# Patient Record
Sex: Female | Born: 1955 | Race: White | Hispanic: No | Smoking: Never smoker
Health system: Southern US, Community
[De-identification: ages and names within clinical notes are randomized; demographics above are authoritative.]

## PROBLEM LIST (undated history)

## (undated) DIAGNOSIS — N189 Chronic kidney disease, unspecified: Secondary | ICD-10-CM

## (undated) DIAGNOSIS — H539 Unspecified visual disturbance: Secondary | ICD-10-CM

## (undated) DIAGNOSIS — K5190 Ulcerative colitis, unspecified, without complications: Secondary | ICD-10-CM

## (undated) DIAGNOSIS — M810 Age-related osteoporosis without current pathological fracture: Secondary | ICD-10-CM

## (undated) DIAGNOSIS — N200 Calculus of kidney: Secondary | ICD-10-CM

## (undated) DIAGNOSIS — G35 Multiple sclerosis: Secondary | ICD-10-CM

## (undated) HISTORY — DX: Age-related osteoporosis without current pathological fracture: M81.0

## (undated) HISTORY — DX: Chronic kidney disease, unspecified: N18.9

## (undated) HISTORY — PX: CHOLECYSTECTOMY, LAPAROSCOPIC: SHX56

## (undated) HISTORY — PX: LITHOTRIPSY: SUR834

## (undated) HISTORY — DX: Ulcerative colitis, unspecified, without complications: K51.90

## (undated) HISTORY — DX: Calculus of kidney: N20.0

## (undated) HISTORY — DX: Unspecified visual disturbance: H53.9

## (undated) HISTORY — DX: Multiple sclerosis: G35

## (undated) HISTORY — PX: WISDOM TOOTH EXTRACTION: SHX21

## (undated) HISTORY — PX: COLOSTOMY: SHX63

---

## 1998-03-09 ENCOUNTER — Ambulatory Visit (HOSPITAL_COMMUNITY): Admission: RE | Admit: 1998-03-09 | Discharge: 1998-03-09 | Payer: Self-pay | Admitting: Gastroenterology

## 1999-08-21 ENCOUNTER — Other Ambulatory Visit: Admission: RE | Admit: 1999-08-21 | Discharge: 1999-08-21 | Payer: Self-pay | Admitting: Family Medicine

## 2000-10-28 ENCOUNTER — Other Ambulatory Visit: Admission: RE | Admit: 2000-10-28 | Discharge: 2000-10-28 | Payer: Self-pay | Admitting: Obstetrics and Gynecology

## 2002-01-28 ENCOUNTER — Other Ambulatory Visit: Admission: RE | Admit: 2002-01-28 | Discharge: 2002-01-28 | Payer: Self-pay | Admitting: Obstetrics and Gynecology

## 2003-06-07 ENCOUNTER — Other Ambulatory Visit: Admission: RE | Admit: 2003-06-07 | Discharge: 2003-06-07 | Payer: Self-pay | Admitting: Obstetrics and Gynecology

## 2012-12-09 ENCOUNTER — Other Ambulatory Visit: Payer: Self-pay | Admitting: Oral Surgery

## 2012-12-09 DIAGNOSIS — R6884 Jaw pain: Secondary | ICD-10-CM

## 2012-12-18 ENCOUNTER — Other Ambulatory Visit: Payer: Self-pay

## 2012-12-19 ENCOUNTER — Ambulatory Visit
Admission: RE | Admit: 2012-12-19 | Discharge: 2012-12-19 | Disposition: A | Discharge status: Home / Self Care | Payer: Medicare Other | Source: Ambulatory Visit | Attending: Oral Surgery | Admitting: Oral Surgery

## 2012-12-19 DIAGNOSIS — R6884 Jaw pain: Secondary | ICD-10-CM

## 2014-08-08 DIAGNOSIS — N200 Calculus of kidney: Secondary | ICD-10-CM | POA: Insufficient documentation

## 2014-08-08 DIAGNOSIS — N201 Calculus of ureter: Secondary | ICD-10-CM | POA: Insufficient documentation

## 2014-11-09 ENCOUNTER — Telehealth: Payer: Self-pay | Admitting: Neurology

## 2014-11-09 NOTE — Telephone Encounter (Signed)
Spoke with Cindy Frederick who has questions about how Tysabri infusions will be handled at [facility].  I advised for now Tysabri's are done at [facility]. I also advised that we plan to start doing infusions at [facility], but am unable to answer specific questions about when or how these will be handled, as [facility] is still in the process of setting this up.  I offered appt with RAS but she declined at this time.  She is aware she must be seen before Tysabri can be ordered./fim

## 2014-11-09 NOTE — Telephone Encounter (Signed)
Patient is calling regarding infusions. Please call and advise.

## 2015-04-12 ENCOUNTER — Telehealth: Payer: Self-pay | Admitting: Neurology

## 2015-04-12 NOTE — Telephone Encounter (Signed)
Thayer Ohm from [place] on behalf of [place] [facility] called and requested to speak with Faith RN regarding the patient. Please call and advise.

## 2015-04-12 NOTE — Telephone Encounter (Signed)
I have spoken with Thayer Ohm who sts. he needs clarification of tx. provided at [facility] in 2015.  I have explained to him that pt. is not a current pt. of Dr. Bonnita Hollow, that she has never been seen in the new office.  He verbalized understanding of same, but sts. this is paperwork that RAS still needs to complete.  I have explained we have no med. records on this pt. to complete any paperwork, and that again, she is not a current pt. in this office and paperwork should be sent to [facility].  Chris sts. he will note that "provider refuses to participate."  I have advised that that is not correct, that pt. is simply not currently a pt. of Dr. Bonnita Hollow.  Finally, I asked Thayer Ohm to fax me the paperwork he is referring to, and asked that he fax it to [phone] (fax machine in my pod), but he sts. he is not able to do this until I fax him a cover letter from our office, as he has a different fax #.  I advised he may use the fax # he has/fim

## 2015-04-16 ENCOUNTER — Other Ambulatory Visit: Payer: Self-pay | Admitting: Neurology

## 2015-04-16 NOTE — Telephone Encounter (Signed)
Patient has not been seen at [facility].  Per Faith we are unable to fill Rx's until the patient is seen here.

## 2015-04-18 NOTE — Telephone Encounter (Signed)
Antrenise called in wanting to know the status of "the paperwork that was faxed for faith to fill out". i read her the message below and she still asked that the paperwork be completed. I faxed her a confirmation page from fax [phone] so she could resend the paperwork. Faith RN aware and stated that Dr. Epimenio Foot has the paperwork and will handle the issue

## 2015-08-30 ENCOUNTER — Telehealth: Payer: Self-pay

## 2015-08-30 NOTE — Telephone Encounter (Signed)
Pt informed. To come by office to p.u. Prednisone pack.

## 2015-08-30 NOTE — Telephone Encounter (Signed)
Prednisone 20 mg x 3 days, 10 mg x 3 days, then stop. Follow up if symptoms persist, progress, or she becomes febrile.

## 2015-08-30 NOTE — Telephone Encounter (Signed)
Pt has viral type symptoms with head congestion, nasal congestion and cough. Has MS and was seen by her MS doctor who agreed that symptoms were viral but may need small dose of Prednisone to help with inflammation and MS exacerbation. Requesting Prednisone.

## 2015-12-26 DIAGNOSIS — R261 Paralytic gait: Secondary | ICD-10-CM | POA: Insufficient documentation

## 2015-12-26 DIAGNOSIS — G35 Multiple sclerosis: Secondary | ICD-10-CM | POA: Insufficient documentation

## 2016-04-18 ENCOUNTER — Telehealth: Payer: Self-pay | Admitting: *Deleted

## 2016-04-18 NOTE — Telephone Encounter (Signed)
LMOM, and I will call her again on Monday am as well.  She has a new pt. appt. sched. with RAS in about 3 weeks.   She is a former pt of his from [facility], and has been seeing Dr. Craige Cotta there since Dr. Epimenio Foot left.  She is now going to transfer care back to Dr. Epimenio Foot.  I believe her Donnamarie Poag is due now, per pt.  Dr. Epimenio Foot is able to work her in on Monday.  I need to check with [facility] first . If they have already received her Ty from a specialty pharmacy, she will need to have Tysabri there.  Also, the process of switching her from [facility] to [facility] for Ty may take up to sev. weeks.  (Because [facility] has to verify benefits, order med, etc.)  So, she may want to proceed with next Ty infusion at [facility] in order to stay on time./fim

## 2016-04-21 ENCOUNTER — Encounter: Payer: Self-pay | Admitting: Neurology

## 2016-04-21 ENCOUNTER — Ambulatory Visit (INDEPENDENT_AMBULATORY_CARE_PROVIDER_SITE_OTHER): Payer: Medicare Other | Admitting: Neurology

## 2016-04-21 VITALS — BP 116/74 | HR 74 | Resp 14 | Ht 59.0 in | Wt 124.0 lb

## 2016-04-21 DIAGNOSIS — G4700 Insomnia, unspecified: Secondary | ICD-10-CM | POA: Insufficient documentation

## 2016-04-21 DIAGNOSIS — G35 Multiple sclerosis: Secondary | ICD-10-CM | POA: Diagnosis not present

## 2016-04-21 DIAGNOSIS — R5383 Other fatigue: Secondary | ICD-10-CM | POA: Insufficient documentation

## 2016-04-21 DIAGNOSIS — F329 Major depressive disorder, single episode, unspecified: Secondary | ICD-10-CM

## 2016-04-21 DIAGNOSIS — F32A Depression, unspecified: Secondary | ICD-10-CM | POA: Insufficient documentation

## 2016-04-21 DIAGNOSIS — R261 Paralytic gait: Secondary | ICD-10-CM | POA: Insufficient documentation

## 2016-04-21 NOTE — Progress Notes (Signed)
[facility]  PATIENT: Cindy Frederick DOB: 02-23-1956  REFERRING DOCTOR OR PCP:  Dr. Ardelle Park (PCP), was seeing Dr. Craige Cotta [facility]) SOURCE: patient, records from Dr. Craige Cotta  _________________________________   HISTORICAL  CHIEF COMPLAINT:  Chief Complaint  Patient presents with  . Multiple Sclerosis    Cindy Frederick is here for eval of MS.  She is a former pt. of Dr. Bonnita Hollow from [facility], and has most recently bee followed by Dr. Craige Cotta at [facility]. Sts. she was dx. in 1991. Presenting sx. was optic neuritis (right eye, then left eye).  Sts. was dx. with MRI. No LP. For the first 5 yrs. she only had po or IV steroids. Around 1996, she started Avonex but stopped about a yr. later due to side effects and inj. site fatigue.  She then started Betaseron but stopped around 2012 due to progression of sx., new   . Gait Disturbance    lesions.  Sts. she then transferred care from Dr. Adella Hare to Dr. Epimenio Foot, and Dr. Epimenio Foot started her on Tysabri. She is due for her next Tysabri infusion 04-23-16.  Sts. JCV ab was last checked 2-3 mos. ago and was low positive. Sts. her last MRI brain was about 18 mos. ago.  She has started Ampyra and feels it helps./fim    HISTORY OF PRESENT ILLNESS:  I had the pleasure seeing you patient, Cindy Frederick, at [facility] for neurologic consultation regarding her multiple sclerosis. I have previously seen her at [facility].   She is currently on Tysabri and has had 57 doses over the last 4-5 years. She would like to switch infusions to this site. She has tested borderline positive is JCV antibody but this has been stable. She was last tested about 3 or 4 months ago.  MS history:    She presented in 1991 with right optic neuritis. She was treated with IV steroids and improved. A year later, she had left optic neuritis and was again treated with IV steroids. She improved that time as well. Around 1996 she had the onset of numbness  in her legs. She was started on Avonex. However, she had trouble tolerating that and one year later switched to Betaseron. She stayed on Betaseron until 2012. In the late 1990's  and the 2000's she had progressive gait disability with left leg weakness and spasticity. I placed her on Tysabri when she transferred to my care in 2012. She has since received 57 infusions of Tysabri. She tolerates it well and has had no exacerbations while on it.     Gait/strength/sensation: She has had worsening of her gait, predominantly due to left leg weakness and spasticity since the late 1990s. She received multiple doses of IV Solu-Medrol in the past. More recently, about a year ago, she was placed on Ampyra and that has helped her gait. She also has some weakness in the left arm though it is not as weak as the leg. She denies any significant numbness or tingling.  Bladder/bowel: She has had some issues with urinary frequency and urgency and has a prescription of Vesicare but she only takes it on rare days when she will be out of the house for long time. She has a colostomy since age 16 because of ulcerative colitis.  Vision: She had a good recovery after her 2 bouts of optic neuritis. However, when she is stressed or tired she will note mild visual blurring.  Mood/cognition: She has had some issues with depression  and received a benefit when started on Cymbalta. She feels mood is better than was a couple years ago. Earlier this year when she was having some issues with her mom's health she had little worsening of her depression but decided not to add a second agent. She denies any significant difficulties with cognition.  Fatigue/sleep: She notes some fatigue that is worse when the temperatures are hot. Amantadine has helped some. She has insomnia helped by melatonin. She sleeps well almost every night now.   REVIEW OF SYSTEMS: Constitutional: No fevers, chills, sweats, or change in appetite.   She notes fatigue.    Some insomnia Eyes: No visual changes, double vision, eye pain Ear, nose and throat: No hearing loss, ear pain, nasal congestion, sore throat Cardiovascular: No chest pain, palpitations Respiratory: No shortness of breath at rest or with exertion.   No wheezes GastrointestinaI: No nausea, vomiting, diarrhea, abdominal pain, fecal incontinence Genitourinary: Notes frequency but no incontinence.  No nocturia. Musculoskeletal: No neck pain, back pain Integumentary: No rash, pruritus, skin lesions Neurological: as above Psychiatric: Mild depression at this time.  No anxiety Endocrine: No palpitations, diaphoresis, change in appetite, change in weigh or increased thirst Hematologic/Lymphatic: No anemia, purpura, petechiae. Allergic/Immunologic: No itchy/runny eyes, nasal congestion, recent allergic reactions, rashes  ALLERGIES: Allergies  Allergen Reactions  . Sulfa Antibiotics Hives  . Betadine [Povidone Iodine] Rash    HOME MEDICATIONS:  Current outpatient prescriptions:  .  alendronate (FOSAMAX) 70 MG tablet, , Disp: , Rfl: 4 .  Amantadine HCl 100 MG tablet, Take 100 mg by mouth 2 (two) times daily., Disp: , Rfl:  .  cholecalciferol (VITAMIN D) 1000 units tablet, Take 2,000 Units by mouth daily., Disp: , Rfl:  .  dalfampridine (AMPYRA) 10 MG TB12, Take 10 mg by mouth 2 (two) times daily., Disp: , Rfl:  .  DULoxetine (CYMBALTA) 60 MG capsule, Take 60 mg by mouth daily., Disp: , Rfl:  .  multivitamin-iron-minerals-folic acid (CENTRUM) chewable tablet, Chew 1 tablet by mouth daily., Disp: , Rfl:  .  natalizumab (TYSABRI) 300 MG/15ML injection, Inject 300 mg into the vein every 30 (thirty) days., Disp: , Rfl:  .  Omega-3 Fatty Acids (FISH OIL OMEGA-3 PO), Take by mouth., Disp: , Rfl:  .  Red Yeast Rice Extract (RED YEAST RICE PO), Take by mouth., Disp: , Rfl:  .  temazepam (RESTORIL) 7.5 MG capsule, 15 mg at bedtime as needed., Disp: , Rfl:  .  vitamin B-12 (CYANOCOBALAMIN) 100 MCG  tablet, Take 100 mcg by mouth daily., Disp: , Rfl:   PAST MEDICAL HISTORY: Past Medical History  Diagnosis Date  . Multiple sclerosis (HCC)   . Vision abnormalities   . Chronic kidney disease   . Kidney stones     PAST SURGICAL HISTORY: Past Surgical History  Procedure Laterality Date  . Lithotripsy    . Cholecystectomy, laparoscopic    . Wisdom tooth extraction      FAMILY HISTORY: Family History  Problem Relation Age of Onset  . Breast cancer Mother   . Hypertension Mother   . Diabetes type II Mother   . Kidney disease Mother   . Congestive Heart Failure Mother   . Heart disease Father   . Hypertension Father   . Seizures Father   . Lymphoma Father   . Epilepsy Father     SOCIAL HISTORY:  Social History   Social History  . Marital Status: Married    Spouse Name: N/A  . Number of  Children: N/A  . Years of Education: N/A   Occupational History  . Not on file.   Social History Main Topics  . Smoking status: Never Smoker   . Smokeless tobacco: Not on file  . Alcohol Use: No  . Drug Use: No  . Sexual Activity: Not on file   Other Topics Concern  . Not on file   Social History Narrative  . No narrative on file     PHYSICAL EXAM  Filed Vitals:   04/21/16 1443  BP: 116/74  Pulse: 74  Resp: 14  Height:  (1.499 m)  Weight: 124 lb (56.246 kg)    Body mass index is 25.03 kg/(m^2).   General: The patient is well-developed and well-nourished and in no acute distress  Eyes:  Funduscopic exam shows normal optic discs and retinal vessels.  Neck: The neck is supple, no carotid bruits are noted.  The neck is nontender.  Cardiovascular: The heart has a regular rate and rhythm with a normal S1 and S2. There were no murmurs, gallops or rubs. Lungs are clear to auscultation.  Skin: Extremities are without significant edema.  Musculoskeletal:  Back is nontender  Neurologic Exam  Mental status: The patient is alert and oriented x 3 at the time  of the examination. The patient has apparent normal recent and remote memory, with an apparently normal attention span and concentration ability.   Speech is normal.  Cranial nerves: Extraocular movements are full. Pupils are equal, round, and reactive to light and accomodation.  Visual fields are full.  Facial symmetry is present. There is good facial sensation to soft touch bilaterally.Facial strength is normal.  Trapezius and sternocleidomastoid strength is normal. No dysarthria is noted.  The tongue is midline, and the patient has symmetric elevation of the soft palate. No obvious hearing deficits are noted.  Motor:  Muscle bulk is normal.   Tone is increased in legs, left > right and mildly in left arm. Strength is  5 / 5 on the right except 4+ right EHL and 4+ to 5/5 in left arm and 4- left EHL/TA and 4 to 4+/5 elsewhere in left leg.   Sensory: Sensory testing is intact to pinprick, soft touch and vibration sensation in all 4 extremities.  Coordination: Cerebellar testing reveals mildly reduced left finger-nose-finger, mildly reduced left RAM in arm and poor left heel-to-shin bilaterally.  Gait and station: Station is normal.   Gait is spastic and she has a mild left foot drop. Tandem gait is wide. Romberg is negative.   Reflexes: Deep tendon reflexes are symmetric and increased in leftr arm and both legs with spread at the left knee.  Plantar responses are flexor.    DIAGNOSTIC DATA (LABS, IMAGING, TESTING) - I reviewed patient records, labs, notes, testing and imaging myself where available.      ASSESSMENT AND PLAN  Multiple sclerosis (HCC)  Spastic gait  Depression  Insomnia  Other fatigue   1.   She will get her next infusion at [facility] since they already have her drug from specialty pharmacy. She will then get her next infusion and subsequent infusions with [place]. 2.   In a few months we will recheck the JCV antibody. 3.   Continue Ampyra, if gait worsens, consider  an AFO fo rthe left. 4.  Continue Cymbalta for mood. 5.   She will return to see me in about 5 months.  45 minute Face-to-face evaluation with greater than one half of the time counseling  correlating care about her MS treatments and symptoms  Daquana Paddock A. Epimenio Foot, MD, PhD 04/21/2016, 2:54 PM Certified in Neurology, Clinical Neurophysiology, Sleep Medicine, Pain Medicine and Neuroimaging  [facility] [address], [address] [place], [place] [address] [phone]

## 2016-04-23 ENCOUNTER — Encounter: Payer: Self-pay | Admitting: *Deleted

## 2016-04-23 ENCOUNTER — Telehealth: Payer: Self-pay | Admitting: Neurology

## 2016-04-23 NOTE — Telephone Encounter (Signed)
Pt called and is supposed to come in for Tysabri . But pt does not have her medication. Pt said the nurse will need to call Cindy at [facility] to get her medication. Please call and advise

## 2016-04-23 NOTE — Telephone Encounter (Signed)
I have spoken with Kd and given update.  Will call her as soon as I have new info/can offer infusion appt.  She verbalized understanding of same/fim

## 2016-04-23 NOTE — Telephone Encounter (Signed)
Pt called back after speaking with [facility] again. They told her Cindy Frederick needed to call them and they would explain it to her. Please call and advise

## 2016-04-23 NOTE — Telephone Encounter (Signed)
I have spoken with Cindy Frederick this morning.  She sts. she has spoken with Cindy Frederick at [facility] and was told they do not have her medicine.  Per [employer] and [employer], her medicine was shipped to [facility] on 04-17-16. (She is on [employer]'s free drug program.)  Pt. expresses frustration, as she is due for her Tysabri infusion today.  We are not able to infuse her, as med. was shipped to [facility].  I have advised Cindy Frederick to call [facility] and request to speak with the office manager to straighten this out/fim

## 2016-04-23 NOTE — Telephone Encounter (Signed)
I have spoken with Arline Asp at [facility].  They received free drug for pt. but will not infuse her.  I have spoken with Annice Pih at [employer].  She will involve a case manager to see how to handle this.  I have faxed a new Ty srf to [employer] so that rx'ing physician can be switched to RAS and Touch auth can be pulled to [facility]. Will check with [name] in the infusion suite to see if pt's infusion can be expedited/fim

## 2016-04-24 ENCOUNTER — Telehealth: Payer: Self-pay | Admitting: Neurology

## 2016-04-24 NOTE — Telephone Encounter (Signed)
Page 4 signed and faxed back to [employer]/fim

## 2016-04-24 NOTE — Telephone Encounter (Signed)
Marisa/[employer] called stating page 4 of touch enrollment for does not have physician signature. Please refill out form and fax.  Phone: [phone] Fax: [phone] They want to confirm pt switching site and physician.

## 2016-04-25 NOTE — Telephone Encounter (Signed)
error 

## 2016-04-30 ENCOUNTER — Encounter: Payer: Self-pay | Admitting: *Deleted

## 2016-05-01 ENCOUNTER — Telehealth: Payer: Self-pay | Admitting: Neurology

## 2016-05-01 NOTE — Telephone Encounter (Signed)
I have spoken with Cindy Frederick, and per [name], advised she expects to be able to call her next week to schedule Tysabri./fim

## 2016-05-01 NOTE — Telephone Encounter (Signed)
Pt called to check status of tysabri delivery.    Pt has a NP appt on 7/21- should that be c/a??

## 2016-05-01 NOTE — Telephone Encounter (Signed)
I have spoken with [name], and per [name], advised she expects to be able to call her next week to schedule Tysabri./fim  

## 2016-05-07 NOTE — Telephone Encounter (Signed)
Patient called regarding Tysabri treatment. Please call [phone].

## 2016-05-07 NOTE — Telephone Encounter (Signed)
I have spoken with Guillerma this afternoon and, after checking with [name], advised her Ty is here--it looks like [facility] is in the final stages of approving infusions.  [name] is [employer], and if she  has not received clearance to sched. Brigid in the next couple of days, she will let us know and RAS will email.  Jannet verbalized understanding of same, is agreeable with this plan/fim

## 2016-05-09 ENCOUNTER — Ambulatory Visit: Payer: Self-pay | Admitting: Neurology

## 2016-05-19 ENCOUNTER — Telehealth: Payer: Self-pay | Admitting: Neurology

## 2016-05-19 NOTE — Telephone Encounter (Signed)
Left message on voicemail for return call.

## 2016-05-19 NOTE — Telephone Encounter (Signed)
Pt called in wanting to know if taking Crestor 10mg  /day will be ok. Pts pcp is wanting to put her on it. She also would like to go over her medication list. Please call after 2p today if possible [phone]

## 2016-05-19 NOTE — Telephone Encounter (Signed)
Cindy Frederick returned my call - she only had a question about the Crestor - she is aware of Dr. Bonnita Hollow response.

## 2016-05-19 NOTE — Telephone Encounter (Signed)
Ok, per Dr. Epimenio Foot, to start Crestor as directed by her PCP.

## 2016-07-14 ENCOUNTER — Telehealth: Payer: Self-pay | Admitting: Neurology

## 2016-07-14 MED ORDER — DULOXETINE HCL 60 MG PO CPEP: 60.0000 mg | ORAL_CAPSULE | Freq: Every day | ORAL | 11 refills | Status: DC

## 2016-07-14 NOTE — Telephone Encounter (Signed)
Cymbalta escribed to [facility] as requestedf/im

## 2016-07-14 NOTE — Telephone Encounter (Signed)
Pt request refill for DULoxetine (CYMBALTA) 60 MG capsule sent to [facility]

## 2016-08-04 ENCOUNTER — Telehealth: Payer: Self-pay | Admitting: Neurology

## 2016-08-04 NOTE — Telephone Encounter (Signed)
I have spoken with Cindy Frederick this morning.  She c/o feeling "foggy" more often--as if her head is stopped up.  Thinks this is possibly related to allergies, for which she sees an allergist.  I have offered appt. with RAS if she feels this is related to her MS.  Her next appt. is sched. for 09-23-16.  I have offered sooner appt. but she has declined.  She will f/u with allergist and call me back for sooner appt. with RAS if sx. persist.  She is alert and oriented times 4; speech is clear delibert, good pace.  Pt. is very appropriate on the phone./fim

## 2016-08-04 NOTE — Telephone Encounter (Signed)
Pt called said for the past couple of months her mind gets foggy/in a daze, she gets very tired intermittently, she has to rest more often. Pt said there are days she won't drive bc of the above reasons. She said she had a physical but did not have any labs done, she was thinking maybe the wellness labs could be ordered by Dr Kathie Rhodes and get the JCV test done also.  She said does have an infusion 10/19.

## 2016-08-07 ENCOUNTER — Encounter: Payer: Self-pay | Admitting: *Deleted

## 2016-08-27 ENCOUNTER — Telehealth: Payer: Self-pay | Admitting: Neurology

## 2016-08-27 NOTE — Telephone Encounter (Signed)
The pt called to advise ampyra will no longer have PAP effective 2018. She advised they are going to send her 2 mths medication and that should last until the end of the year. Please call convenience

## 2016-08-28 NOTE — Telephone Encounter (Signed)
I have spoken with Cindy Frederick today--she just wanted to provide an update, that per Ampyra pt. support services, they will no longer offer pt. assistance/ generic version of Ampyra will be available soon.  She has applied for financial grants to help cover the cost until then.  She has 2 mos. of med left/fim

## 2016-09-23 ENCOUNTER — Ambulatory Visit: Payer: Medicare Other | Admitting: Neurology

## 2016-10-06 ENCOUNTER — Encounter: Payer: Self-pay | Admitting: Neurology

## 2016-10-06 ENCOUNTER — Ambulatory Visit (INDEPENDENT_AMBULATORY_CARE_PROVIDER_SITE_OTHER): Payer: Medicare Other | Admitting: Neurology

## 2016-10-06 VITALS — BP 114/72 | HR 78 | Resp 16 | Ht 59.0 in | Wt 132.5 lb

## 2016-10-06 DIAGNOSIS — R350 Frequency of micturition: Secondary | ICD-10-CM | POA: Diagnosis not present

## 2016-10-06 DIAGNOSIS — R261 Paralytic gait: Secondary | ICD-10-CM

## 2016-10-06 DIAGNOSIS — R5383 Other fatigue: Secondary | ICD-10-CM | POA: Diagnosis not present

## 2016-10-06 DIAGNOSIS — G35 Multiple sclerosis: Secondary | ICD-10-CM

## 2016-10-06 MED ORDER — DALFAMPRIDINE POWD: 5.0000 mg | Freq: Three times a day (TID) | 11 refills | Status: DC

## 2016-10-06 MED ORDER — LORAZEPAM 1 MG PO TABS: ORAL_TABLET | ORAL | 0 refills | Status: DC

## 2016-10-06 NOTE — Progress Notes (Signed)
[facility]  PATIENT: Cindy Frederick DOB: 09-18-1956  REFERRING DOCTOR OR PCP:  Dr. Ardelle Park (PCP),  SOURCE: patient, records from Dr. Craige Cotta  _________________________________   HISTORICAL  CHIEF COMPLAINT:  Chief Complaint  Patient presents with  . Multiple Sclerosis    Sts. she continues to tolerate Tysabri well.  JCV ab last checked at [facility] and was low positive.  She sts.  Ampyra Pt Support has advised that Ampyra will be available in generic form sometime in the near future, so this will affect pt. assistance/copay/fim    HISTORY OF PRESENT ILLNESS:  Cindy Frederick is a 60 yo woman with multiple sclerosis. She is currently on Tysabri  For the past 5 years.   She has tested borderline positive is JCV antibody but this has been stable.   Last test 6-7 months ago.     Gait/strength/sensation: She feels gait is stable.   She was placed on Ampyra and that has helped her gait.   If she misses a few doses she has a noticeable worse gait.   She is on patient assistance but funds are running out.  She also has some weakness in the left arm though it is not as weak as the leg. She denies any significant numbness or tingling..   If spasms are worse she takes a valium (rarely takes just needs one script.year)  Bladder/bowel: She has had some issues with urinary frequency and urgency and has a prescription of Vesicare but she only takes it on rare days when she will be out of the house for long time. She has a colostomy since age 47 because of ulcerative colitis.  Vision: She had a good recovery after her 2 bouts of optic neuritis. However, when she is stressed or tired she will note mild visual blurring.  Mood/cognition: Mood id better on Cymbalta. She feels mood is better than was a couple years ago. Earlier this year when she was having some issues with her mom's health she had little worsening of her depression but decided not to add a second agent. She  denies any significant difficulties with cognition.  Fatigue/sleep: She notes some fatigue that is worse when the temperatures are hot. Amantadine has helped the physical fatigue. She has insomnia helped by melatonin. She sleeps well almost every night now.  MS history:    She presented in 1991 with right optic neuritis. She was treated with IV steroids and improved. A year later, she had left optic neuritis and was again treated with IV steroids. She improved that time as well. Around 1996 she had the onset of numbness in her legs. She was started on Avonex. However, she had trouble tolerating that and one year later switched to Betaseron. She stayed on Betaseron until 2012. In the late 1990's  and the 2000's she had progressive gait disability with left leg weakness and spasticity. I placed her on Tysabri when she transferred to my care in 2012.  She tolerates it well and has had no exacerbations while on it.     REVIEW OF SYSTEMS: Constitutional: No fevers, chills, sweats, or change in appetite.   She notes fatigue.   Some insomnia Eyes: No visual changes, double vision, eye pain Ear, nose and throat: No hearing loss, ear pain, nasal congestion, sore throat Cardiovascular: No chest pain, palpitations Respiratory: No shortness of breath at rest or with exertion.   No wheezes GastrointestinaI: No nausea, vomiting, diarrhea, abdominal pain, fecal incontinence Genitourinary: Notes frequency  but no incontinence.  No nocturia. Musculoskeletal: No neck pain, back pain Integumentary: No rash, pruritus, skin lesions Neurological: as above Psychiatric: Mild depression at this time.  No anxiety Endocrine: No palpitations, diaphoresis, change in appetite, change in weigh or increased thirst Hematologic/Lymphatic: No anemia, purpura, petechiae. Allergic/Immunologic: No itchy/runny eyes, nasal congestion, recent allergic reactions, rashes  ALLERGIES: Allergies  Allergen Reactions  . Sulfa  Antibiotics Hives  . Betadine [Povidone Iodine] Rash    HOME MEDICATIONS:  Current Outpatient Prescriptions:  .  alendronate (FOSAMAX) 70 MG tablet, , Disp: , Rfl: 4 .  Amantadine HCl 100 MG tablet, Take 100 mg by mouth 2 (two) times daily., Disp: , Rfl:  .  cholecalciferol (VITAMIN D) 1000 units tablet, Take 2,000 Units by mouth daily., Disp: , Rfl:  .  dalfampridine (AMPYRA) 10 MG TB12, Take 10 mg by mouth 2 (two) times daily., Disp: , Rfl:  .  Dalfampridine POWD, 5 mg by Does not apply route 3 (three) times daily., Disp: 100 Bottle, Rfl: 11 .  DULoxetine (CYMBALTA) 60 MG capsule, Take 1 capsule (60 mg total) by mouth daily., Disp: 30 capsule, Rfl: 11 .  LORazepam (ATIVAN) 1 MG tablet, Take 2 - 3 pills before MRI, Disp: 3 tablet, Rfl: 0 .  multivitamin-iron-minerals-folic acid (CENTRUM) chewable tablet, Chew 1 tablet by mouth daily., Disp: , Rfl:  .  natalizumab (TYSABRI) 300 MG/15ML injection, Inject 300 mg into the vein every 30 (thirty) days., Disp: , Rfl:  .  Omega-3 Fatty Acids (FISH OIL OMEGA-3 PO), Take by mouth., Disp: , Rfl:  .  Red Yeast Rice Extract (RED YEAST RICE PO), Take by mouth., Disp: , Rfl:  .  vitamin B-12 (CYANOCOBALAMIN) 100 MCG tablet, Take 100 mcg by mouth daily., Disp: , Rfl:   PAST MEDICAL HISTORY: Past Medical History:  Diagnosis Date  . Chronic kidney disease   . Kidney stones   . Multiple sclerosis (HCC)   . Vision abnormalities     PAST SURGICAL HISTORY: Past Surgical History:  Procedure Laterality Date  . CHOLECYSTECTOMY, LAPAROSCOPIC    . LITHOTRIPSY    . WISDOM TOOTH EXTRACTION      FAMILY HISTORY: Family History  Problem Relation Age of Onset  . Breast cancer Mother   . Hypertension Mother   . Diabetes type II Mother   . Kidney disease Mother   . Congestive Heart Failure Mother   . Heart disease Father   . Hypertension Father   . Seizures Father   . Lymphoma Father   . Epilepsy Father     SOCIAL HISTORY:  Social History    Social History  . Marital status: Married    Spouse name: N/A  . Number of children: N/A  . Years of education: N/A   Occupational History  . Not on file.   Social History Main Topics  . Smoking status: Never Smoker  . Smokeless tobacco: Not on file  . Alcohol use No  . Drug use: No  . Sexual activity: Not on file   Other Topics Concern  . Not on file   Social History Narrative  . No narrative on file     PHYSICAL EXAM  Vitals:   10/06/16 1307  BP: 114/72  Pulse: 78  Resp: 16  Weight: 132 lb 8 oz (60.1 kg)  Height: 4\' 11"  (1.499 m)    Body mass index is 26.76 kg/m.   General: The patient is well-developed and well-nourished and in no acute distress  Neurologic Exam  Mental status: The patient is alert and oriented x 3 at the time of the examination. The patient has apparent normal recent and remote memory, with an apparently normal attention span and concentration ability.   Speech is normal.  Cranial nerves: Extraocular movements are full. Pupils are equal, round, and reactive to light and accomodation.  Visual fields are full.  Facial symmetry is present. There is good facial sensation to soft touch bilaterally.Facial strength is normal.  Trapezius and sternocleidomastoid strength is normal. No dysarthria is noted.  The tongue is midline, and the patient has symmetric elevation of the soft palate. No obvious hearing deficits are noted.  Motor:  Muscle bulk is normal.   Tone is increased in legs, left > right and mildly in left arm. Strength is  5 / 5 on the right except 4+ right EHL and 4+ to 5/5 in left arm and 4- left EHL/TA and 4 to 4+/5 elsewhere in left leg.   Sensory: Sensory testing is intact to pinprick, soft touch and vibration sensation in all 4 extremities.  Coordination: Cerebellar testing reveals mildly reduced left finger-nose-finger, mildly reduced left RAM in arm and poor left heel-to-shin bilaterally.  Gait and station: Station is normal.    Gait is spastic and she has a mild left foot drop. Tandem gait is wide. Romberg is negative.   Reflexes: Deep tendon reflexes are symmetric and increased in leftr arm and both legs with spread at the left knee.      DIAGNOSTIC DATA (LABS, IMAGING, TESTING) - I reviewed patient records, labs, notes, testing and imaging myself where available.      ASSESSMENT AND PLAN  Multiple sclerosis (HCC) - Plan: Stratify JCV Antibody Test ([facility]), CBC with Differential/Platelet, Hepatic function panel, MR BRAIN W WO CONTRAST  Spastic gait - Plan: MR BRAIN W WO CONTRAST  Other fatigue  Urinary frequency  1.   Continue Tysabri 300 mg every 4 weeks.  2.   Recheck the JCV antibody and other labs 3.   Change Ampyra to 5 mg tid 4-AP , if gait worsens, consider an AFO (left). 4.  Continue Cymbalta for mood.   Valium can be refilled if needed for spasticity (still has some) 5.   She will return to see me in about 5 months.   Johnny Gorter A. Epimenio Foot, MD, PhD 10/06/2016, 3:14 PM Certified in Neurology, Clinical Neurophysiology, Sleep Medicine, Pain Medicine and Neuroimaging  [facility] [address], [address] [place], [place] [address] [phone] '

## 2016-10-07 LAB — CBC WITH DIFFERENTIAL/PLATELET
BASOS ABS: 0 10*3/uL (ref 0.0–0.2)
BASOS: 0 %
EOS (ABSOLUTE): 0.2 10*3/uL (ref 0.0–0.4)
Eos: 3 %
Hematocrit: 42 % (ref 34.0–46.6)
Hemoglobin: 13.8 g/dL (ref 11.1–15.9)
Immature Grans (Abs): 0 10*3/uL (ref 0.0–0.1)
Immature Granulocytes: 0 %
LYMPHS ABS: 2.2 10*3/uL (ref 0.7–3.1)
LYMPHS: 32 %
MCH: 30.1 pg (ref 26.6–33.0)
MCHC: 32.9 g/dL (ref 31.5–35.7)
MCV: 92 fL (ref 79–97)
MONOS ABS: 0.9 10*3/uL (ref 0.1–0.9)
Monocytes: 12 %
NEUTROS ABS: 3.6 10*3/uL (ref 1.4–7.0)
Neutrophils: 53 %
PLATELETS: 251 10*3/uL (ref 150–379)
RBC: 4.58 x10E6/uL (ref 3.77–5.28)
RDW: 14.1 % (ref 12.3–15.4)
WBC: 6.9 10*3/uL (ref 3.4–10.8)

## 2016-10-07 LAB — HEPATIC FUNCTION PANEL
ALT: 29 IU/L (ref 0–32)
AST: 25 IU/L (ref 0–40)
Albumin: 4.7 g/dL (ref 3.5–5.5)
Alkaline Phosphatase: 104 IU/L (ref 39–117)
Bilirubin Total: 0.6 mg/dL (ref 0.0–1.2)
Bilirubin, Direct: 0.17 mg/dL (ref 0.00–0.40)
TOTAL PROTEIN: 7.1 g/dL (ref 6.0–8.5)

## 2016-10-16 ENCOUNTER — Telehealth: Payer: Self-pay | Admitting: *Deleted

## 2016-10-16 NOTE — Telephone Encounter (Signed)
Labs collected 10/06/16:  JCV ab 0.25 indeterminate

## 2016-10-20 DIAGNOSIS — M810 Age-related osteoporosis without current pathological fracture: Secondary | ICD-10-CM

## 2016-10-20 HISTORY — DX: Age-related osteoporosis without current pathological fracture: M81.0

## 2016-11-01 ENCOUNTER — Other Ambulatory Visit: Payer: Self-pay

## 2016-11-13 ENCOUNTER — Telehealth: Payer: Self-pay | Admitting: *Deleted

## 2016-11-13 NOTE — Telephone Encounter (Signed)
Patient called for refill of Ampyra , sent to [facility] in [place].

## 2016-11-17 NOTE — Telephone Encounter (Signed)
Per Faith, patient gets this prescription at [facility].

## 2016-11-18 MED ORDER — DALFAMPRIDINE ER 10 MG PO TB12: 10.0000 mg | ORAL_TABLET | Freq: Two times a day (BID) | ORAL | 11 refills | Status: DC

## 2016-11-18 NOTE — Telephone Encounter (Signed)
Patient requesting refill of Ampyra called to [facility] Telephone# [phone].

## 2016-11-18 NOTE — Addendum Note (Signed)
Addended by: Candis Schatz I on: 11/18/2016 04:32 PM   Modules accepted: Orders

## 2016-11-18 NOTE — Telephone Encounter (Signed)
I have spoken with Cindy Frederick.  She was switched from Ampyra 10mg  po bid to compounded 4-AP due to financial reasons.  She now has financial assistance for Ampyra again.  Per her request, I have escribed rx. to [facility]/fim

## 2016-11-20 ENCOUNTER — Telehealth: Payer: Self-pay | Admitting: Neurology

## 2016-11-20 ENCOUNTER — Other Ambulatory Visit: Payer: Self-pay | Admitting: Neurology

## 2016-11-20 MED ORDER — BACLOFEN 10 MG PO TABS: ORAL_TABLET | ORAL | 1 refills | Status: DC

## 2016-11-20 NOTE — Telephone Encounter (Signed)
She has had a lot more spasms in her legs the last couple days. This is unusual for her as she had not had much spasticity for many years. No more tramadol. As she is on Ampyra, the risk of seizures may be higher if she also takes tramadol. Therefore, Tylenol would be a better option for her right now. I will also call in some baclofen. She should call us back next week and I would consider her rare prn benzodiazepine.

## 2016-11-24 ENCOUNTER — Telehealth: Payer: Self-pay | Admitting: Neurology

## 2016-11-24 NOTE — Telephone Encounter (Signed)
Patient called in reference to taking hemp-flower extract 250mg .  Patient used it as a sleep aid, but patient is wanting to know if it is okay to continue with her other medications.  Please call

## 2016-11-24 NOTE — Telephone Encounter (Signed)
I have spoken with Cindy Frederick this afternoon, and explained that Cindy Frederick did not feel hemp-flower extract would interfere with any of her medications and would be ok for her to take, however she may want to check with her pcp as well.  Cindy Frederick verbalized understanding of same/fim

## 2016-12-02 ENCOUNTER — Other Ambulatory Visit: Payer: Self-pay

## 2016-12-07 ENCOUNTER — Ambulatory Visit
Admission: RE | Admit: 2016-12-07 | Discharge: 2016-12-07 | Disposition: A | Discharge status: Home / Self Care | Payer: Medicare Other | Source: Ambulatory Visit | Attending: Neurology | Admitting: Neurology

## 2016-12-07 DIAGNOSIS — G35 Multiple sclerosis: Secondary | ICD-10-CM

## 2016-12-07 DIAGNOSIS — R261 Paralytic gait: Secondary | ICD-10-CM

## 2016-12-07 MED ORDER — GADOBENATE DIMEGLUMINE 529 MG/ML IV SOLN
12.0000 mL | Freq: Once | INTRAVENOUS | Status: AC | PRN
  Administered 2016-12-07: 12 mL via INTRAVENOUS

## 2016-12-08 ENCOUNTER — Telehealth: Payer: Self-pay | Admitting: Neurology

## 2016-12-08 NOTE — Telephone Encounter (Signed)
Pt request MRI results. She was advised it can take 2-4 days for MRI to be read

## 2016-12-08 NOTE — Telephone Encounter (Signed)
Noted.  Will call pt. with results once they are available/fim

## 2016-12-09 ENCOUNTER — Telehealth: Payer: Self-pay | Admitting: *Deleted

## 2016-12-09 NOTE — Telephone Encounter (Signed)
-----   Message from Asa Lente, MD sent at 12/09/2016  8:53 AM EST ----- Please let her know that there are no new MS lesions on mri

## 2016-12-09 NOTE — Telephone Encounter (Signed)
LMOM that per RAS, no new MS lesions noted on MRI.  She does not need to return this call unless she has questions/fim

## 2016-12-11 ENCOUNTER — Telehealth: Payer: Self-pay | Admitting: Neurology

## 2016-12-11 MED ORDER — DALFAMPRIDINE ER 10 MG PO TB12: 10.0000 mg | ORAL_TABLET | Freq: Two times a day (BID) | ORAL | 3 refills | Status: DC

## 2016-12-11 NOTE — Addendum Note (Signed)
Addended by: Candis Schatz I on: 12/11/2016 04:01 PM   Modules accepted: Orders

## 2016-12-11 NOTE — Telephone Encounter (Signed)
90 day rx. for Ampyra escribed to [facility] per pt's request/fim

## 2016-12-11 NOTE — Telephone Encounter (Signed)
Patient requesting refill of dalfampridine (AMPYRA) 10 MG TB12 #90 called to [facility].

## 2016-12-13 ENCOUNTER — Other Ambulatory Visit: Payer: Self-pay

## 2017-03-31 ENCOUNTER — Telehealth: Payer: Self-pay | Admitting: Neurology

## 2017-03-31 NOTE — Telephone Encounter (Signed)
I refaxed forms to Cindy Frederick, with the message that forms are not crumpled--this is the way we received them from [employer].  It appears that the printer skipped lines.  Which is why I wrote answers in to some of the questions that weren't completely printed.  If they would like to fax to forms, I am happy to complete them/fim

## 2017-03-31 NOTE — Telephone Encounter (Signed)
Cindy Frederick/[employer] [phone] Cindy Frederick she rec'd the forms but they were not coming thru complete, some are crinkled, missing information. Please refax to [phone]. She said it was faxed from [phone]

## 2017-04-06 ENCOUNTER — Encounter (INDEPENDENT_AMBULATORY_CARE_PROVIDER_SITE_OTHER): Payer: Self-pay

## 2017-04-06 ENCOUNTER — Encounter: Payer: Self-pay | Admitting: Neurology

## 2017-04-06 ENCOUNTER — Ambulatory Visit (INDEPENDENT_AMBULATORY_CARE_PROVIDER_SITE_OTHER): Payer: Medicare Other | Admitting: Neurology

## 2017-04-06 VITALS — BP 118/70 | HR 78 | Resp 6 | Ht 59.0 in | Wt 134.0 lb

## 2017-04-06 DIAGNOSIS — R350 Frequency of micturition: Secondary | ICD-10-CM

## 2017-04-06 DIAGNOSIS — F329 Major depressive disorder, single episode, unspecified: Secondary | ICD-10-CM

## 2017-04-06 DIAGNOSIS — F32A Depression, unspecified: Secondary | ICD-10-CM

## 2017-04-06 DIAGNOSIS — G4700 Insomnia, unspecified: Secondary | ICD-10-CM

## 2017-04-06 DIAGNOSIS — R5383 Other fatigue: Secondary | ICD-10-CM | POA: Diagnosis not present

## 2017-04-06 DIAGNOSIS — R261 Paralytic gait: Secondary | ICD-10-CM

## 2017-04-06 DIAGNOSIS — G35 Multiple sclerosis: Secondary | ICD-10-CM

## 2017-04-06 MED ORDER — DIAZEPAM 5 MG PO TABS: ORAL_TABLET | ORAL | 2 refills | Status: DC

## 2017-04-06 NOTE — Progress Notes (Signed)
[facility]  PATIENT: Cindy Frederick DOB: Dec 31, 1955  REFERRING DOCTOR OR PCP:  Dr. Ardelle Park (PCP),   _________________________________   HISTORICAL  CHIEF COMPLAINT:  Chief Complaint  Patient presents with  . Multiple Sclerosis    Sts. she continues to tolerate Tysabri well.  JCV ab was 0.25 on 10/06/16.  She would like to discuss prn Valium for anxiety/fim    HISTORY OF PRESENT ILLNESS:  Cindy Frederick is a 61 yo woman with multiple sclerosis.   MS:   She is currently on Tysabri and has been so for the past 5 or 6 years. She tolerates it well. She has been borderline positive for the JCV antibody on multiple tests. Her last titer was 0.25 six months ago.    Gait/strength/sensation: Her gait is stable. She has sided leg and arm weakness and spasticity. She has received some benefit from Ampyra.    She gets spasticity especially in the left leg. When the spasms are worse she will take a Valium with benefit. She also rarely takes a Valium if she has more anxiety.  Spasticity is worse when she feels stressed.    Bladder/bowel: She urinary frequency and urgency.   She was once on Vesicare but it did not help much. . She has a colostomy since age 57 because of ulcerative colitis.  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  Vision is worse if tired or stressed.   Mood/cognition: She has mild depression and mild anxiety.    Her mood does better with Cymbalta.  She gets stressed some days and that increases her anxiety.   Her mom is elderly with health issues.   She denies any significant difficulties with cognition.  Fatigue/sleep: She has fatigue, worse when she is hot .   Amantadine has helped the physical fatigue. She has sleep onset insomnia helped by melatonin most nights though about once a week, she still has poor. She sleeps well almost every night now.  MS history:    She presented in 1991 with right optic neuritis. She was treated  with IV steroids and improved. A year later, she had left optic neuritis and was again treated with IV steroids. She improved that time as well. Around 1996 she had the onset of numbness in her legs. She was started on Avonex. However, she had trouble tolerating that and one year later switched to Betaseron. She stayed on Betaseron until 2012. In the late 1990's  and the 2000's she had progressive gait disability with left leg weakness and spasticity. I placed her on Tysabri when she transferred to my care in 2012.  She tolerates it well and has had no exacerbations while on it.     REVIEW OF SYSTEMS: Constitutional: No fevers, chills, sweats, or change in appetite.   She notes fatigue.   Some insomnia Eyes: No visual changes, double vision, eye pain Ear, nose and throat: No hearing loss, ear pain, nasal congestion, sore throat Cardiovascular: No chest pain, palpitations Respiratory: No shortness of breath at rest or with exertion.   No wheezes GastrointestinaI: No nausea, vomiting, diarrhea, abdominal pain, fecal incontinence Genitourinary: Notes frequency but no incontinence.  No nocturia. Musculoskeletal: No neck pain, back pain Integumentary: No rash, pruritus, skin lesions Neurological: as above Psychiatric: Mild depression at this time.  No anxiety Endocrine: No palpitations, diaphoresis, change in appetite, change in weigh or increased thirst Hematologic/Lymphatic: No anemia, purpura, petechiae. Allergic/Immunologic: No itchy/runny eyes, nasal congestion, recent  allergic reactions, rashes  ALLERGIES: Allergies  Allergen Reactions  . Sulfa Antibiotics Hives  . Betadine [Povidone Iodine] Rash    HOME MEDICATIONS:  Current Outpatient Prescriptions:  .  alendronate (FOSAMAX) 70 MG tablet, , Disp: , Rfl: 4 .  Amantadine HCl 100 MG tablet, Take 100 mg by mouth 2 (two) times daily., Disp: , Rfl:  .  baclofen (LIORESAL) 10 MG tablet, TAKE 1 TABLET BY MOUTH UP TO THREE TIMES DAILY AS  NEEDED, Disp: 270 tablet, Rfl: 1 .  cholecalciferol (VITAMIN D) 1000 units tablet, Take 2,000 Units by mouth daily., Disp: , Rfl:  .  dalfampridine (AMPYRA) 10 MG TB12, Take 1 tablet (10 mg total) by mouth 2 (two) times daily., Disp: 180 tablet, Rfl: 3 .  DULoxetine (CYMBALTA) 60 MG capsule, Take 1 capsule (60 mg total) by mouth daily., Disp: 30 capsule, Rfl: 11 .  LORazepam (ATIVAN) 1 MG tablet, Take 2 - 3 pills before MRI, Disp: 3 tablet, Rfl: 0 .  multivitamin-iron-minerals-folic acid (CENTRUM) chewable tablet, Chew 1 tablet by mouth daily., Disp: , Rfl:  .  Omega-3 Fatty Acids (FISH OIL OMEGA-3 PO), Take by mouth., Disp: , Rfl:  .  Red Yeast Rice Extract (RED YEAST RICE PO), Take by mouth., Disp: , Rfl:  .  vitamin B-12 (CYANOCOBALAMIN) 100 MCG tablet, Take 100 mcg by mouth daily., Disp: , Rfl:  .  Dalfampridine POWD, 5 mg by Does not apply route 3 (three) times daily. (Patient not taking: Reported on 04/06/2017), Disp: 100 Bottle, Rfl: 11 .  diazepam (VALIUM) 5 MG tablet, Take up to one a day, Disp: 30 tablet, Rfl: 2 .  natalizumab (TYSABRI) 300 MG/15ML injection, Inject 300 mg into the vein every 30 (thirty) days., Disp: , Rfl:   PAST MEDICAL HISTORY: Past Medical History:  Diagnosis Date  . Chronic kidney disease   . Kidney stones   . Multiple sclerosis (HCC)   . Vision abnormalities     PAST SURGICAL HISTORY: Past Surgical History:  Procedure Laterality Date  . CHOLECYSTECTOMY, LAPAROSCOPIC    . LITHOTRIPSY    . WISDOM TOOTH EXTRACTION      FAMILY HISTORY: Family History  Problem Relation Age of Onset  . Breast cancer Mother   . Hypertension Mother   . Diabetes type II Mother   . Kidney disease Mother   . Congestive Heart Failure Mother   . Heart disease Father   . Hypertension Father   . Seizures Father   . Lymphoma Father   . Epilepsy Father     SOCIAL HISTORY:  Social History   Social History  . Marital status: Married    Spouse name: N/A  . Number of  children: N/A  . Years of education: N/A   Occupational History  . Not on file.   Social History Main Topics  . Smoking status: Never Smoker  . Smokeless tobacco: Never Used  . Alcohol use No  . Drug use: No  . Sexual activity: Not on file   Other Topics Concern  . Not on file   Social History Narrative  . No narrative on file     PHYSICAL EXAM  Vitals:   04/06/17 1305  BP: 118/70  Pulse: 78  Resp: (!) 6  Weight: 134 lb (60.8 kg)  Height: 4\' 11"  (1.499 m)    Body mass index is 27.06 kg/m.   General: The patient is well-developed and well-nourished and in no acute distress   Neurologic Exam  Mental status:  The patient is alert and oriented x 3 at the time of the examination. The patient has apparent normal recent and remote memory, with an apparently normal attention span and concentration ability.   Speech is normal.  Cranial nerves: Extraocular movements are normal. The pupils react equally to light and accommodation. Facial strength and sensation is normal. Trapezius and sternocleidomastoid strength is normal.  The tongue is midline, and the patient has symmetric elevation of the soft palate. No obvious hearing deficits are noted.  Motor:  Muscle bulk is normal.   Muscle tone is increased in the legs, left more than right. There is slight increased muscle tone in the left arm. Strength is 5/5 on the right. Strength is 4+ to 5/5 in the left arm and 4 minus to 4/5 in the left leg.   Sensory: She has normal symmetric sensation to touch, temperature and vibration..  Coordination: Cerebellar testing reveals mildly reduced left finger-nose-finger, mildly reduced left RAM in arm and poor left heel-to-shin bilaterally.  Gait and station: Station is normal.   Her gait is spastic. There is a left foot drop. She cannot do a tandem gait without holding with unilateral support. Romberg is negative.  Reflexes: Deep tendon reflexes are symmetric and increased in the left arm  and both legs. There is spread at the knees. Marland Kitchen      DIAGNOSTIC DATA (LABS, IMAGING, TESTING) - I reviewed patient records, labs, notes, testing and imaging myself where available.      ASSESSMENT AND PLAN  Multiple sclerosis (HCC) - Plan: CBC with Differential/Platelet, Stratify JCV Antibody Test ([facility])  Spastic gait  Other fatigue  Insomnia, unspecified type  Depression, unspecified depression type  Urinary frequency  1.   Tysabri 300 mg every 4 weeks.   Recheck the JCV antibody and other labs 2.   Stay active and exercise as tolerated 3.   Ampyra 10 mg po bid.   , if gait worsens, consider an AFO (left). 4.   Cymbalta for mood.   Prn Valium for spasticity and rare anxiety 5.   She will return to see me in about 6 months.   Richard A. Epimenio Foot, MD, PhD 04/06/2017, 2:40 PM Certified in Neurology, Clinical Neurophysiology, Sleep Medicine, Pain Medicine and Neuroimaging  [facility] [address] [place], [place] [address] [phone] '

## 2017-04-07 ENCOUNTER — Encounter: Payer: Self-pay | Admitting: Allergy

## 2017-04-07 ENCOUNTER — Ambulatory Visit (INDEPENDENT_AMBULATORY_CARE_PROVIDER_SITE_OTHER): Payer: Medicare Other | Admitting: Allergy

## 2017-04-07 VITALS — BP 116/74 | HR 96 | Resp 20 | Ht 59.0 in | Wt 133.0 lb

## 2017-04-07 DIAGNOSIS — J309 Allergic rhinitis, unspecified: Secondary | ICD-10-CM | POA: Diagnosis not present

## 2017-04-07 DIAGNOSIS — J0100 Acute maxillary sinusitis, unspecified: Secondary | ICD-10-CM | POA: Diagnosis not present

## 2017-04-07 DIAGNOSIS — H1010 Acute atopic conjunctivitis, unspecified eye: Secondary | ICD-10-CM

## 2017-04-07 LAB — CBC WITH DIFFERENTIAL/PLATELET
BASOS: 1 %
Basophils Absolute: 0 10*3/uL (ref 0.0–0.2)
EOS (ABSOLUTE): 0.3 10*3/uL (ref 0.0–0.4)
EOS: 4 %
HEMATOCRIT: 41.1 % (ref 34.0–46.6)
HEMOGLOBIN: 13.5 g/dL (ref 11.1–15.9)
Immature Grans (Abs): 0 10*3/uL (ref 0.0–0.1)
Immature Granulocytes: 0 %
LYMPHS ABS: 2.1 10*3/uL (ref 0.7–3.1)
Lymphs: 33 %
MCH: 29.5 pg (ref 26.6–33.0)
MCHC: 32.8 g/dL (ref 31.5–35.7)
MCV: 90 fL (ref 79–97)
MONOCYTES: 8 %
Monocytes Absolute: 0.5 10*3/uL (ref 0.1–0.9)
NEUTROS ABS: 3.6 10*3/uL (ref 1.4–7.0)
Neutrophils: 54 %
Platelets: 213 10*3/uL (ref 150–379)
RBC: 4.57 x10E6/uL (ref 3.77–5.28)
RDW: 14.2 % (ref 12.3–15.4)
WBC: 6.6 10*3/uL (ref 3.4–10.8)

## 2017-04-07 NOTE — Patient Instructions (Signed)
Acute sinusitis    - continue nasal saline rinse daily while sick.  Use prior to nasal spray use    - use Nasacort 1-2 sprays each nostril daily    - use Mucinex 1-2 tabs every 12 hours to help thin/drain mucus.  Drink with plenty of water    - provided with prednisone 10mg  (5 tabs) daily  Allergic rhinoconjunctivitis    - continue daily Allertec   Follow-up 1 yr or sooner if needed.  Let us know if current symptoms are not improving by end of week.

## 2017-04-07 NOTE — Progress Notes (Signed)
Follow-up Note  RE: CHRYSTAL BURGARD MRN: [id number] DOB: 26-Aug-1956 Date of Office Visit: 04/07/2017   History of present illness: Cindy Frederick is a 61 y.o. female presenting today for sick visit.  She was last seen in the office on 12/14/14 by Dr. Lucie Leather.   She reports she has been doing well since last visit as to why she has not returned sooner.  She takes aller-tec daily that controls her allergy symptoms well.   She presents today with 2 days of bilateral ear pain, nasal drainage, maxillary sinus pressure and tightness across frontal sinus.  She did feel warm to touch and took her temperature without fever.  No known sick exposures.  She did do saline rinse last night.  She reports that she usually improves with 2 days worth of prednisone.    She has history of MS.    Review of systems: Review of Systems  Constitutional: Negative for chills, fever and malaise/fatigue.  HENT: Positive for congestion, ear pain and sinus pain. Negative for ear discharge, hearing loss, nosebleeds, sore throat and tinnitus.   Eyes: Negative for discharge and redness.  Respiratory: Negative for cough, shortness of breath and wheezing.   Cardiovascular: Negative for chest pain.  Gastrointestinal: Negative for abdominal pain, diarrhea, heartburn, nausea and vomiting.  Musculoskeletal: Negative for joint pain and myalgias.  Skin: Negative for itching and rash.  Neurological: Positive for headaches. Negative for dizziness.    All other systems negative unless noted above in HPI  Past medical/social/surgical/family history have been reviewed and are unchanged unless specifically indicated below.  No changes  Medication List: Allergies as of 04/07/2017      Reactions   Sulfa Antibiotics Hives   Betadine [povidone Iodine] Rash      Medication List       Accurate as of 04/07/17  3:00 PM. Always use your most recent med list.          Amantadine HCl 100 MG tablet Take 100 mg by mouth 2 (two)  times daily.   baclofen 10 MG tablet Commonly known as:  LIORESAL TAKE 1 TABLET BY MOUTH UP TO THREE TIMES DAILY AS NEEDED   cholecalciferol 1000 units tablet Commonly known as:  VITAMIN D Take 2,000 Units by mouth daily.   dalfampridine 10 MG Tb12 Commonly known as:  AMPYRA Take 1 tablet (10 mg total) by mouth 2 (two) times daily.   denosumab 60 MG/ML Soln injection Commonly known as:  PROLIA Inject 60 mg into the skin every 6 (six) months. Administer in upper arm, thigh, or abdomen   diazepam 5 MG tablet Commonly known as:  VALIUM Take up to one a day   DULoxetine 60 MG capsule Commonly known as:  CYMBALTA Take 1 capsule (60 mg total) by mouth daily.   multivitamin-iron-minerals-folic acid chewable tablet Chew 1 tablet by mouth daily.   TYSABRI 300 MG/15ML injection Generic drug:  natalizumab Inject into the vein every 28 (twenty-eight) days.   TYSABRI 300 MG/15ML injection Generic drug:  natalizumab Inject 300 mg into the vein every 30 (thirty) days.       Known medication allergies: Allergies  Allergen Reactions  . Sulfa Antibiotics Hives  . Betadine [Povidone Iodine] Rash     Physical examination: Blood pressure 116/74, pulse 96, resp. rate 20, height 4\' 11"  (1.499 m), weight 133 lb (60.3 kg).  General: Alert, interactive, in no acute distress. HEENT: PERRLA, TMs pearly gray, turbinates moderately edematous with thick discharge, post-pharynx non  erythematous. Mild TTP over maxillary sinus Neck: Supple without lymphadenopathy. Lungs: Clear to auscultation without wheezing, rhonchi or rales. {no increased work of breathing. CV: Normal S1, S2 without murmurs. Abdomen: Nondistended, nontender. Skin: Warm and dry, without lesions or rashes. Extremities:  No clubbing, cyanosis or edema. Neuro:   Grossly intact.  Diagnositics/Labs: None today  Assessment and plan:   Acute sinusitis. maxillary    - continue nasal saline rinse daily while sick.  Use  prior to nasal spray use    - use Nasacort 1-2 sprays each nostril daily    - use Mucinex 1-2 tabs every 12 hours to help thin/drain mucus.  Drink with plenty of water    - provided with prednisone 10mg  (5 tabs) daily  Allergic rhinoconjunctivitis    - continue daily Allertec   Follow-up 1 yr or sooner if needed.  Let us know if current symptoms are not improving by end of week.     I appreciate the opportunity to take part in [place] care. Please do not hesitate to contact me with questions.  Sincerely,   Margo Aye, MD Allergy/Immunology [facility]

## 2017-04-08 ENCOUNTER — Telehealth: Payer: Self-pay | Admitting: Neurology

## 2017-04-08 NOTE — Telephone Encounter (Signed)
Left message for Eunice Blase that she needs to contact [facility] staff by calling [phone] and asking for Mindy or [name]/fim

## 2017-04-08 NOTE — Telephone Encounter (Signed)
Debbie from [employer] re: the authorization of Taabri the fax needs to be resent, portions of it were whitted out , mising information please send again. If there are questions Eunice Blase can be reached at [phone] xt [phone] Fax #[phone]

## 2017-04-13 ENCOUNTER — Other Ambulatory Visit: Payer: Self-pay

## 2017-04-13 ENCOUNTER — Telehealth: Payer: Self-pay

## 2017-04-13 MED ORDER — AMOXICILLIN-POT CLAVULANATE 875-125 MG PO TABS: 1.0000 | ORAL_TABLET | Freq: Two times a day (BID) | ORAL | 0 refills | Status: DC

## 2017-04-13 MED ORDER — PREDNISONE 10 MG PO TABS: 10.0000 mg | ORAL_TABLET | Freq: Every day | ORAL | 0 refills | Status: DC

## 2017-04-13 MED ORDER — AMOXICILLIN-POT CLAVULANATE 875-125 MG PO TABS: 1.0000 | ORAL_TABLET | Freq: Two times a day (BID) | ORAL | 0 refills | Status: AC

## 2017-04-13 NOTE — Telephone Encounter (Signed)
Please provide Gordie with Augmentin 875 one tablet twice a day for 10 days and prednisone 10 mg tablet 1 time per day for 10 days

## 2017-04-13 NOTE — Telephone Encounter (Signed)
Pt advised, RX sent. 

## 2017-04-13 NOTE — Telephone Encounter (Signed)
Pt seen by Dr. Delorse Lek last Tuesday. Took Prednisone 10 mg x 2 days and Mucinex. Thought she was getting better, but today she is feeling really bad again with sinus pressure, some nasal drainage with blood, very stopped up and ears hurting. Backed down on Nasacort to 1 spray in each nostril once a day. Doing sinus rinses.

## 2017-04-14 ENCOUNTER — Encounter: Payer: Self-pay | Admitting: *Deleted

## 2017-04-27 ENCOUNTER — Encounter: Payer: Self-pay | Admitting: Neurology

## 2017-05-04 ENCOUNTER — Encounter: Payer: Self-pay | Admitting: *Deleted

## 2017-05-19 ENCOUNTER — Telehealth: Payer: Self-pay | Admitting: Neurology

## 2017-05-19 NOTE — Telephone Encounter (Signed)
I have spoken with Cindy Frederick this afternoon.  She takes Valium at night for extremity spasticity and anxiety.  She sts. pcp rec. Xanax b/c it won't cause next day hung over feeling.  Per RAS, I have explained that it can be dangerous to mix this type of medication--can cause over sedation/decreased respiration.  She verbalized understanding of same, will continue Valium for now/fim

## 2017-05-19 NOTE — Telephone Encounter (Signed)
Patient called office in reference to being prescribed Xanax 0.5 (as needed-) with her family Dr. She would like to be sure she is able to take this with other medications.  Please call

## 2017-06-22 ENCOUNTER — Other Ambulatory Visit: Payer: Self-pay | Admitting: Neurology

## 2017-06-24 ENCOUNTER — Telehealth: Payer: Self-pay | Admitting: Neurology

## 2017-06-24 MED ORDER — DULOXETINE HCL 60 MG PO CPEP: 60.0000 mg | ORAL_CAPSULE | Freq: Every day | ORAL | 3 refills | Status: DC

## 2017-06-24 NOTE — Addendum Note (Signed)
Addended by: Candis Schatz I on: 06/24/2017 01:31 PM   Modules accepted: Orders

## 2017-06-24 NOTE — Telephone Encounter (Signed)
Cymbalta, 90 day rx. escribed to [facility] as requested/fim

## 2017-06-24 NOTE — Telephone Encounter (Signed)
Patient would like a new Rx called in for DULoxetine (CYMBALTA) 60 MG capsule #90. She said #30 was called in and she did not pick it up because she wanted #90. Please call to [facility] in [place].

## 2017-09-24 ENCOUNTER — Telehealth: Payer: Self-pay

## 2017-09-24 MED ORDER — AMOXICILLIN-POT CLAVULANATE 875-125 MG PO TABS: ORAL_TABLET | ORAL | 0 refills | Status: DC

## 2017-09-24 MED ORDER — PREDNISONE 10 MG PO TABS: ORAL_TABLET | ORAL | 0 refills | Status: DC

## 2017-09-24 NOTE — Telephone Encounter (Signed)
Pt states that she had an MS attack 3 days ago and now has developed bad sinus issues. Very congested in her nasal passages, with yellow drainage and lots of PND. Has voice hoarsness. Head feels stopped up and ears feel full . Also has sore throat. Using all med as prescribed. Dr. Lucie LeatherKozlow is out of the office this afternoon and she cannot get in to see her PCP. Requesting prednisone and/or antibiotics. Due to her MS, she states that if not treated, her symptoms will become very bad. Please advise.

## 2017-09-24 NOTE — Telephone Encounter (Signed)
Spoke to patient advised of plan. [facility] [place]

## 2017-09-24 NOTE — Telephone Encounter (Signed)
   Prednisone, 40 mg x3 days, 20 mg x1 day, 10 mg x1 day, then stop.  Augmentin 875/125 mg 1 p.o. twice daily times 10 days.  Nasal saline lavage (NeilMed) has been recommended as needed and prior to medicated nasal sprays along with instructions for proper administration.

## 2017-09-24 NOTE — Addendum Note (Signed)
Addended by: Bennye Alm on: 09/24/2017 05:45 PM   Modules accepted: Orders

## 2017-10-05 ENCOUNTER — Encounter: Payer: Self-pay | Admitting: Neurology

## 2017-10-05 ENCOUNTER — Ambulatory Visit: Payer: Medicare Other | Admitting: Neurology

## 2017-10-05 VITALS — BP 106/67 | HR 62 | Resp 14 | Ht 59.0 in | Wt 133.0 lb

## 2017-10-05 DIAGNOSIS — Z79899 Other long term (current) drug therapy: Secondary | ICD-10-CM

## 2017-10-05 DIAGNOSIS — R5383 Other fatigue: Secondary | ICD-10-CM

## 2017-10-05 DIAGNOSIS — R269 Unspecified abnormalities of gait and mobility: Secondary | ICD-10-CM | POA: Insufficient documentation

## 2017-10-05 DIAGNOSIS — F329 Major depressive disorder, single episode, unspecified: Secondary | ICD-10-CM

## 2017-10-05 DIAGNOSIS — F32A Depression, unspecified: Secondary | ICD-10-CM

## 2017-10-05 DIAGNOSIS — G35 Multiple sclerosis: Secondary | ICD-10-CM | POA: Diagnosis not present

## 2017-10-05 DIAGNOSIS — R350 Frequency of micturition: Secondary | ICD-10-CM

## 2017-10-05 MED ORDER — LORAZEPAM 1 MG PO TABS: ORAL_TABLET | ORAL | 0 refills | Status: DC

## 2017-10-05 NOTE — Progress Notes (Signed)
[facility]  PATIENT: Cindy Frederick DOB: 1956/06/28  REFERRING DOCTOR OR PCP:  Dr. Ardelle ParkHaque (PCP),   _________________________________   HISTORICAL  CHIEF COMPLAINT:  Chief Complaint  Patient presents with  . Multiple Sclerosis    HISTORY OF PRESENT ILLNESS:  Cindy Frederick is a 61 yo woman with multiple sclerosis.   Update 10/05/2017: Her MS is mostly stable.   She is on Tysabri and has not had any exacerbation.  Her next infusion is the 26th of December. She has  weakness, spasticity and clumsiness in her left leg and mild weakness in the left arm.   Ampyra has helped her gait some.     She uses a cane.    Bladder function is unchanged with mild leakage at times..   Vision is stable.     Fatigue has done ok (mild but unchanged).    She is usually able to finish all of her tasks.    Amantadine and B12 have her some.   She has had more family stress (son on substances and having other issues, mother lives with her and she watches grandchild a lot).   Mood has ben mostly stable and she takes valium as needed.  Cognition is fine.   From 04/06/2017: MS:   She is currently on Tysabri and has been so for the past 5 or 6 years. She tolerates it well. She has been borderline positive for the JCV antibody on multiple tests. Her last titer was 0.25 six months ago.    Gait/strength/sensation: Her gait is stable. She has sided leg and arm weakness and spasticity. She has received some benefit from Ampyra.    She gets spasticity especially in the left leg. When the spasms are worse she will take a Valium with benefit. She also rarely takes a Valium if she has more anxiety.  Spasticity is worse when she feels stressed.    Bladder/bowel: She urinary frequency and urgency.   She was once on Vesicare but it did not help much. . She has a colostomy since age 61 because of ulcerative colitis.  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  Vision is worse  if tired or stressed.   Mood/cognition: She has mild depression and mild anxiety.    Her mood does better with Cymbalta.  She gets stressed some days and that increases her anxiety.   Her mom is elderly with health issues.   She denies any significant difficulties with cognition.  Fatigue/sleep: She has fatigue, worse when she is hot .   Amantadine has helped the physical fatigue. She has sleep onset insomnia helped by melatonin most nights though about once a week, she still has poor. She sleeps well almost every night now.  MS history:    She presented in 1991 with right optic neuritis. She was treated with IV steroids and improved. A year later, she had left optic neuritis and was again treated with IV steroids. She improved that time as well. Around 1996 she had the onset of numbness in her legs. She was started on Avonex. However, she had trouble tolerating that and one year later switched to Betaseron. She stayed on Betaseron until 2012. In the late 1990's  and the 2000's she had progressive gait disability with left leg weakness and spasticity. I placed her on Tysabri when she transferred to my care in 2012.  She tolerates it well and has had no exacerbations while on it.  REVIEW OF SYSTEMS: Constitutional: No fevers, chills, sweats, or change in appetite.   She notes fatigue.   Some insomnia Eyes: No visual changes, double vision, eye pain Ear, nose and throat: No hearing loss, ear pain, nasal congestion, sore throat Cardiovascular: No chest pain, palpitations Respiratory: No shortness of breath at rest or with exertion.   No wheezes GastrointestinaI: No nausea, vomiting, diarrhea, abdominal pain, fecal incontinence Genitourinary: Notes frequency but no incontinence.  No nocturia. Musculoskeletal: No neck pain, back pain Integumentary: No rash, pruritus, skin lesions Neurological: as above Psychiatric: Mild depression at this time.  No anxiety Endocrine: No palpitations,  diaphoresis, change in appetite, change in weigh or increased thirst Hematologic/Lymphatic: No anemia, purpura, petechiae. Allergic/Immunologic: No itchy/runny eyes, nasal congestion, recent allergic reactions, rashes  ALLERGIES: Allergies  Allergen Reactions  . Sulfa Antibiotics Hives  . Betadine [Povidone Iodine] Rash    HOME MEDICATIONS:  Current Outpatient Medications:  .  Amantadine HCl 100 MG tablet, Take 100 mg by mouth 2 (two) times daily., Disp: , Rfl:  .  cholecalciferol (VITAMIN D) 1000 units tablet, Take 2,000 Units by mouth daily., Disp: , Rfl:  .  dalfampridine (AMPYRA) 10 MG TB12, Take 1 tablet (10 mg total) by mouth 2 (two) times daily., Disp: 180 tablet, Rfl: 3 .  denosumab (PROLIA) 60 MG/ML SOLN injection, Inject 60 mg into the skin every 6 (six) months. Administer in upper arm, thigh, or abdomen, Disp: , Rfl:  .  diazepam (VALIUM) 5 MG tablet, Take up to one a day, Disp: 30 tablet, Rfl: 2 .  DULoxetine (CYMBALTA) 60 MG capsule, Take 1 capsule (60 mg total) by mouth daily., Disp: 90 capsule, Rfl: 3 .  LORazepam (ATIVAN) 1 MG tablet, Take one to two before the MRi, Disp: 2 tablet, Rfl: 0 .  multivitamin-iron-minerals-folic acid (CENTRUM) chewable tablet, Chew 1 tablet by mouth daily., Disp: , Rfl:  .  natalizumab (TYSABRI) 300 MG/15ML injection, Inject into the vein every 28 (twenty-eight) days., Disp: , Rfl:  .  amoxicillin-clavulanate (AUGMENTIN) 875-125 MG tablet, Take 1 tablet twice daily for 10 days, Disp: 20 tablet, Rfl: 0 .  baclofen (LIORESAL) 10 MG tablet, TAKE 1 TABLET BY MOUTH UP TO THREE TIMES DAILY AS NEEDED (Patient not taking: Reported on 10/05/2017), Disp: 270 tablet, Rfl: 1 .  natalizumab (TYSABRI) 300 MG/15ML injection, Inject 300 mg into the vein every 30 (thirty) days., Disp: , Rfl:  .  predniSONE (DELTASONE) 10 MG tablet, Take 40mg  x 3 days, 20 mg x 1 day, then 10 mg x 1 day, Disp: 15 tablet, Rfl: 0  PAST MEDICAL HISTORY: Past Medical History:    Diagnosis Date  . Chronic kidney disease   . Kidney stones   . Multiple sclerosis (HCC)   . Osteoporosis 2018  . Vision abnormalities     PAST SURGICAL HISTORY: Past Surgical History:  Procedure Laterality Date  . CHOLECYSTECTOMY, LAPAROSCOPIC    . LITHOTRIPSY    . WISDOM TOOTH EXTRACTION      FAMILY HISTORY: Family History  Problem Relation Age of Onset  . Breast cancer Mother   . Hypertension Mother   . Diabetes type II Mother   . Kidney disease Mother   . Congestive Heart Failure Mother   . Heart disease Father   . Hypertension Father   . Seizures Father   . Lymphoma Father   . Epilepsy Father     SOCIAL HISTORY:  Social History   Socioeconomic History  . Marital status: Married  Spouse name: Not on file  . Number of children: Not on file  . Years of education: Not on file  . Highest education level: Not on file  Social Needs  . Financial resource strain: Not on file  . Food insecurity - worry: Not on file  . Food insecurity - inability: Not on file  . Transportation needs - medical: Not on file  . Transportation needs - non-medical: Not on file  Occupational History  . Not on file  Tobacco Use  . Smoking status: Never Smoker  . Smokeless tobacco: Never Used  Substance and Sexual Activity  . Alcohol use: No    Alcohol/week: 0.0 oz  . Drug use: No  . Sexual activity: Not on file  Other Topics Concern  . Not on file  Social History Narrative  . Not on file     PHYSICAL EXAM  Vitals:   10/05/17 1155  BP: 106/67  Pulse: 62  Resp: 14  Weight: 133 lb (60.3 kg)  Height: 4\' 11"  (1.499 m)    Body mass index is 26.86 kg/m.   General: The patient is well-developed and well-nourished and in no acute distress   Neurologic Exam  Mental status: The patient is alert and oriented x 3 at the time of the examination. The patient has apparent normal recent and remote memory, with an apparently normal attention span and concentration ability.    Speech is normal.  Cranial nerves: Extraocular movements are normal. Facial strength and sensation is normal. Trapezius strength is normal The tongue is midline, and the patient has symmetric elevation of the soft palate. No obvious hearing deficits are noted.  Motor:  Muscle bulk is normal.   Muscle tone is increased in the legs, left more than right. There is slight increased muscle tone in the left arm. Strength is 5/5 on the right. Strength is 4++/5 in the left arm and 4 - to 4/5 in the left leg.   Sensory: She has normal symmetric sensation to touch, temperature and vibration..  Coordination: Cerebellar testing shows reduced finger-nose-finger on the left and poor heel-to-shin on the left.   Gait and station: Station is normal.   Her gait is spastic. There is a left foot drop. She cannot do a tandem gait without holding with unilateral support. Romberg is negative.  Reflexes: Deep tendon reflexes are symmetric and increased in the left arm and both legs. There is spread at the knees, left much more than right    DIAGNOSTIC DATA (LABS, IMAGING, TESTING) - I reviewed patient records, labs, notes, testing and imaging myself where available.      ASSESSMENT AND PLAN  Multiple sclerosis (HCC) - Plan: MR BRAIN W WO CONTRAST, CBC with Differential/Platelet, Stratify JCV Antibody Test ([facility])  Gait disorder  Other fatigue  Urinary frequency  Depression, unspecified depression type  High risk medication use - Plan: CBC with Differential/Platelet, Stratify JCV Antibody Test ([facility])  1.   Continue Tysabri 300 mg every 4 weeks.  I will recheck his JCV antibody and CBC today.  We will check an MRI of the brain to make sure that she is not having subclinical progression. If present, we will need to consider a change in therapy. 2.   Stay active and exercise as tolerated 3.   Continue mpyra 10 mg po bid.   , if gait worsens, consider an AFO (left). 4.   Cymbalta for mood.   Prn Valium  for spasticity and rare anxiety 5.  She will return to see me in about 6 months.   Johnesha Acheampong A. Epimenio Foot, MD, PhD 10/05/2017, 12:00 PM Certified in Neurology, Clinical Neurophysiology, Sleep Medicine, Pain Medicine and Neuroimaging  [facility] [address], [address] [place], [place] [address] [phone]

## 2017-10-06 LAB — CBC WITH DIFFERENTIAL/PLATELET
BASOS ABS: 0 10*3/uL (ref 0.0–0.2)
Basos: 1 %
EOS (ABSOLUTE): 0.2 10*3/uL (ref 0.0–0.4)
EOS: 3 %
HEMATOCRIT: 40 % (ref 34.0–46.6)
HEMOGLOBIN: 13.1 g/dL (ref 11.1–15.9)
IMMATURE GRANS (ABS): 0.1 10*3/uL (ref 0.0–0.1)
Immature Granulocytes: 1 %
LYMPHS ABS: 2.5 10*3/uL (ref 0.7–3.1)
LYMPHS: 34 %
MCH: 29.6 pg (ref 26.6–33.0)
MCHC: 32.8 g/dL (ref 31.5–35.7)
MCV: 91 fL (ref 79–97)
MONOCYTES: 11 %
Monocytes Absolute: 0.8 10*3/uL (ref 0.1–0.9)
Neutrophils Absolute: 3.6 10*3/uL (ref 1.4–7.0)
Neutrophils: 50 %
Platelets: 261 10*3/uL (ref 150–379)
RBC: 4.42 x10E6/uL (ref 3.77–5.28)
RDW: 14.4 % (ref 12.3–15.4)
WBC: 7.3 10*3/uL (ref 3.4–10.8)

## 2017-10-14 ENCOUNTER — Telehealth: Payer: Self-pay | Admitting: *Deleted

## 2017-10-14 NOTE — Telephone Encounter (Signed)
Labs collected 10/05/17:  JCV ab Indeterminate at 0.34.  JCV inhibition assay positive.

## 2017-12-04 ENCOUNTER — Other Ambulatory Visit: Payer: Self-pay | Admitting: Neurology

## 2017-12-23 ENCOUNTER — Telehealth: Payer: Self-pay | Admitting: Neurology

## 2017-12-23 MED ORDER — DALFAMPRIDINE ER 10 MG PO TB12: 10.0000 mg | ORAL_TABLET | Freq: Two times a day (BID) | ORAL | 3 refills | Status: DC

## 2017-12-23 NOTE — Telephone Encounter (Signed)
Morrie Sheldon with [facility] called requesting a refill for dalfampridine (AMPYRA) 10 MG TB12 any questions contact at [phone]

## 2017-12-23 NOTE — Telephone Encounter (Signed)
Ampyra escribed to [facility] as requested/fim

## 2018-01-11 ENCOUNTER — Telehealth: Payer: Self-pay | Admitting: *Deleted

## 2018-01-11 MED ORDER — DIAZEPAM 5 MG PO TABS: ORAL_TABLET | ORAL | 2 refills | Status: DC

## 2018-01-11 NOTE — Telephone Encounter (Signed)
Rx. awaiting RAS sig/fim 

## 2018-02-10 ENCOUNTER — Telehealth: Payer: Self-pay | Admitting: Neurology

## 2018-02-10 NOTE — Telephone Encounter (Signed)
Spoke with Armetta and received feedback regarding the infusion suite; passed this along to Dr. Lenice Pressman

## 2018-02-10 NOTE — Telephone Encounter (Signed)
Patient calling to discuss the infusion department and her being unhappy with them.

## 2018-03-16 ENCOUNTER — Ambulatory Visit: Payer: Medicare Other | Admitting: Allergy

## 2018-03-16 ENCOUNTER — Encounter: Payer: Self-pay | Admitting: Allergy

## 2018-03-16 VITALS — BP 130/80 | HR 88 | Resp 16

## 2018-03-16 DIAGNOSIS — J309 Allergic rhinitis, unspecified: Secondary | ICD-10-CM | POA: Diagnosis not present

## 2018-03-16 DIAGNOSIS — H1010 Acute atopic conjunctivitis, unspecified eye: Secondary | ICD-10-CM | POA: Diagnosis not present

## 2018-03-16 NOTE — Progress Notes (Signed)
Follow-up Note  RE: Cindy Frederick MRN: [id number] DOB: [birth year] Date of Office Visit: 03/16/2018   History of present illness: Cindy Frederick is a 62 y.o. female presenting today for breathing issues.  She states for the past month or so she has been having nasal congestion which has been causing her to have more difficulty breathing.  She also has noted clear rhinorrhea but denies any PND or throat clearing. She denies cough, wheeze, chest tightness or SOB.  She points to across her nasal bridge area where she feels it is difficult to breath but denies any sinus pain or pressure.  Denies any fevers.  She states 2 months her mother passed away and she is the executive of estate and thus has been going through her mother's home and cleaning out things.  She states she is going to items that have been sitting around untouched for likely years.  She is taking allertec daily and has been using Flonase 1 spray each nostril once a daily for the past month and does state that it has been helpful.  She also has utilized nasal saline rinses in the past but has not used recently in the past month.      Review of systems: Review of Systems  Constitutional: Negative for chills, fever and malaise/fatigue.  HENT: Positive for congestion and sinus pain. Negative for ear discharge, ear pain, nosebleeds and sore throat.   Eyes: Negative for pain, discharge and redness.  Respiratory: Negative for cough, sputum production, shortness of breath and wheezing.   Cardiovascular: Negative for chest pain.  Gastrointestinal: Negative for abdominal pain, constipation, diarrhea, heartburn, nausea and vomiting.  Musculoskeletal: Negative for joint pain.  Skin: Negative for itching and rash.  Neurological: Negative for headaches.    All other systems negative unless noted above in HPI  Past medical/social/surgical/family history have been reviewed and are unchanged unless specifically indicated below.  No  changes  Medication List: Allergies as of 03/16/2018      Reactions   Sulfa Antibiotics Hives   Betadine [povidone Iodine] Rash      Medication List        Accurate as of 03/16/18  4:49 PM. Always use your most recent med list.          amLODipine 5 MG tablet Commonly known as:  NORVASC TK 1 T PO ONCE A DAY   baclofen 10 MG tablet Commonly known as:  LIORESAL TAKE 1 TABLET BY MOUTH UP TO THREE TIMES DAILY AS NEEDED   cetirizine 10 MG tablet Commonly known as:  ZYRTEC Take 10 mg by mouth daily.   cyanocobalamin 1000 MCG/ML injection Commonly known as:  (VITAMIN B-12) INJ 1 ML UTD Q 2 WEEKS   dalfampridine 10 MG Tb12 Commonly known as:  AMPYRA Take 1 tablet (10 mg total) by mouth 2 (two) times daily.   denosumab 60 MG/ML Soln injection Commonly known as:  PROLIA Inject 60 mg into the skin every 6 (six) months. Administer in upper arm, thigh, or abdomen   diazepam 5 MG tablet Commonly known as:  VALIUM Take up to one a day   DULoxetine 60 MG capsule Commonly known as:  CYMBALTA Take 1 capsule (60 mg total) by mouth daily.   fluticasone 50 MCG/ACT nasal spray Commonly known as:  FLONASE SHAKE LQ AND U 1 SPR IEN QD   LORazepam 1 MG tablet Commonly known as:  ATIVAN Take one to two before the MRi   MELATONIN PO  Take by mouth.   multivitamin-iron-minerals-folic acid chewable tablet Chew 1 tablet by mouth daily.   rosuvastatin 20 MG tablet Commonly known as:  CRESTOR Take 20 mg by mouth daily.   TYSABRI 300 MG/15ML injection Generic drug:  natalizumab Inject into the vein every 28 (twenty-eight) days.   TYSABRI 300 MG/15ML injection Generic drug:  natalizumab Inject 300 mg into the vein every 30 (thirty) days.   VITAMIN D3 PO Take 2,000 Units by mouth daily.       Known medication allergies: Allergies  Allergen Reactions  . Sulfa Antibiotics Hives  . Betadine [Povidone Iodine] Rash     Physical examination: Blood pressure 130/80, pulse  88, resp. rate 16.  General: Alert, interactive, in no acute distress. HEENT: PERRLA, Ms pearly gray, turbinates moderately edematous with clear discharge, post-pharynx non erythematous. Neck: Supple without lymphadenopathy. Lungs: Clear to auscultation without wheezing, rhonchi or rales. {no increased work of breathing. CV: Normal S1, S2 without murmurs. Abdomen: Nondistended, nontender. Skin: Warm and dry, without lesions or rashes. Extremities:  No clubbing, cyanosis or edema. Neuro:   Grossly intact.  Diagnositics/Labs: None today  Assessment and plan:   Allergic rhinoconjunctivitis    - now with increased nasal congestion and sinus pressure likely related to all environmental exposures including pollens and indoor allergens    - continue daily Allertec    - increase nasal steroid spray, Flonase, to 2 sprays each nostril 1-2 times a day.  Use for 1-2 weeks at a time before stopping once symptoms improve    - resume use of nasal saline rinses especially after cleaning of mother's home and prior to nasal spray use.     Follow-up 6 months or sooner if needed.      I appreciate the opportunity to take part in [place] care. Please do not hesitate to contact me with questions.  Sincerely,   Margo Aye, MD Allergy/Immunology [facility]

## 2018-03-16 NOTE — Patient Instructions (Signed)
Allergic rhinoconjunctivitis    - now with increased nasal congestion and sinus pressure likely related to all environmental exposures including pollens and indoor allergens    - continue daily Allertec    - increase nasal steroid spray, Flonase, to 2 sprays each nostril 1-2 times a day.  Use for 1-2 weeks at a time before stopping once symptoms improve    - resume use of nasal saline rinses especially after cleaning of mother's home and prior to nasal spray use.     Follow-up 6 months or sooner if needed.

## 2018-03-30 ENCOUNTER — Encounter: Payer: Self-pay | Admitting: *Deleted

## 2018-04-06 ENCOUNTER — Encounter: Payer: Self-pay | Admitting: Neurology

## 2018-04-06 ENCOUNTER — Telehealth: Payer: Self-pay | Admitting: Neurology

## 2018-04-06 ENCOUNTER — Ambulatory Visit: Payer: Medicare Other | Admitting: Neurology

## 2018-04-06 VITALS — BP 122/71 | HR 99 | Ht 59.0 in | Wt 129.0 lb

## 2018-04-06 DIAGNOSIS — Z79899 Other long term (current) drug therapy: Secondary | ICD-10-CM

## 2018-04-06 DIAGNOSIS — F329 Major depressive disorder, single episode, unspecified: Secondary | ICD-10-CM

## 2018-04-06 DIAGNOSIS — R350 Frequency of micturition: Secondary | ICD-10-CM

## 2018-04-06 DIAGNOSIS — F32A Depression, unspecified: Secondary | ICD-10-CM

## 2018-04-06 DIAGNOSIS — R5383 Other fatigue: Secondary | ICD-10-CM

## 2018-04-06 DIAGNOSIS — G35 Multiple sclerosis: Secondary | ICD-10-CM

## 2018-04-06 DIAGNOSIS — R261 Paralytic gait: Secondary | ICD-10-CM

## 2018-04-06 MED ORDER — BUPROPION HCL ER (XL) 150 MG PO TB24: 150.0000 mg | ORAL_TABLET | Freq: Every day | ORAL | 11 refills | Status: DC

## 2018-04-06 NOTE — Telephone Encounter (Signed)
I called and left a message.  Earlier today, I sent in a prescription for bupropion.  However, there is a theoretical interaction with Ampyra that could increase the risk of seizure.  Therefore, I would prefer her not to mix the 2 medications.  I also left a message with the pharmacy to cancel the prescription.  Can you please call her tomorrow to make sure that she got the message.

## 2018-04-06 NOTE — Telephone Encounter (Signed)
[employer] order sent to [facility]. They will reach out to the pt to schedule.  °

## 2018-04-06 NOTE — Progress Notes (Signed)
[facility]  PATIENT: Cindy Frederick DOB: [birth year]  REFERRING DOCTOR OR PCP:  Dr. Ardelle Park (PCP),   _________________________________   HISTORICAL  CHIEF COMPLAINT:  Chief Complaint  Patient presents with  . Multiple Sclerosis    Pt lost her mother about 2 months ago. She has been having a lot of fatigue.    HISTORY OF PRESENT ILLNESS:  Cindy Frederick is a 62 yo woman with multiple sclerosis.   Update 04/06/2018: For the most part, her MS has been stable.    She has been on Tysabri.   She denies any exacerbation.   She tolerates it well.     Her main problem is left sided (leg > arm) weakness and spasticity.    She gets cramps in both legs.    She uses a cane.    Ampyra has helped her some.     Bladder function is the same (mild urge incontinence).   Vision is fine.   She has had some depression since her mom dies recently.    There are family issues as Libertie is the executor of the will.     She notes more fatigue.     She notes some apathy (usually enjoys yard-work)   She is on Cymbalta and felt she did well until 3 weeks ago. She also takes valium prn.     She now has more tearfulness.    She sleeps ok.   Cognition is fine.     Update 10/05/2017: Her MS is mostly stable.   She is on Tysabri and has not had any exacerbation.  Her next infusion is the 26th of December. She has  weakness, spasticity and clumsiness in her left leg and mild weakness in the left arm.   Ampyra has helped her gait some.     She uses a cane.    Bladder function is unchanged with mild leakage at times..   Vision is stable.     Fatigue has done ok (mild but unchanged).    She is usually able to finish all of her tasks.    Amantadine and B12 have her some.   She has had more family stress (son on substances and having other issues, mother lives with her and she watches grandchild a lot).   Mood has ben mostly stable and she takes valium as needed.  Cognition is fine.   From 04/06/2017: MS:    She is currently on Tysabri and has been so for the past 5 or 6 years. She tolerates it well. She has been borderline positive for the JCV antibody on multiple tests. Her last titer was 0.25 six months ago.    Gait/strength/sensation: Her gait is stable. She has sided leg and arm weakness and spasticity. She has received some benefit from Ampyra.    She gets spasticity especially in the left leg. When the spasms are worse she will take a Valium with benefit. She also rarely takes a Valium if she has more anxiety.  Spasticity is worse when she feels stressed.    Bladder/bowel: She urinary frequency and urgency.   She was once on Vesicare but it did not help much. . She has a colostomy since age 44 because of ulcerative colitis.  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  Vision is worse if tired or stressed.   Mood/cognition: She has mild depression and mild anxiety.    Her mood does better with Cymbalta.  She gets stressed some days and that increases her anxiety.   Her mom is elderly with health issues.   She denies any significant difficulties with cognition.  Fatigue/sleep: She has fatigue, worse when she is hot .   Amantadine has helped the physical fatigue. She has sleep onset insomnia helped by melatonin most nights though about once a week, she still has poor. She sleeps well almost every night now.  MS history:    She presented in 1991 with right optic neuritis. She was treated with IV steroids and improved. A year later, she had left optic neuritis and was again treated with IV steroids. She improved that time as well. Around 1996 she had the onset of numbness in her legs. She was started on Avonex. However, she had trouble tolerating that and one year later switched to Betaseron. She stayed on Betaseron until 2012. In the late 1990's  and the 2000's she had progressive gait disability with left leg weakness and spasticity. I placed her on Tysabri when she  transferred to my care in 2012.  She tolerates it well and has had no exacerbations while on it.     REVIEW OF SYSTEMS: Constitutional: No fevers, chills, sweats, or change in appetite.   She notes fatigue.   Some insomnia Eyes: No visual changes, double vision, eye pain Ear, nose and throat: No hearing loss, ear pain, nasal congestion, sore throat Cardiovascular: No chest pain, palpitations Respiratory: No shortness of breath at rest or with exertion.   No wheezes GastrointestinaI: No nausea, vomiting, diarrhea, abdominal pain, fecal incontinence Genitourinary: Notes frequency but no incontinence.  No nocturia. Musculoskeletal: No neck pain, back pain Integumentary: No rash, pruritus, skin lesions Neurological: as above Psychiatric: Mild depression at this time.  No anxiety Endocrine: No palpitations, diaphoresis, change in appetite, change in weigh or increased thirst Hematologic/Lymphatic: No anemia, purpura, petechiae. Allergic/Immunologic: No itchy/runny eyes, nasal congestion, recent allergic reactions, rashes  ALLERGIES: Allergies  Allergen Reactions  . Sulfa Antibiotics Hives  . Betadine [Povidone Iodine] Rash    HOME MEDICATIONS:  Current Outpatient Medications:  .  amLODipine (NORVASC) 5 MG tablet, TK 1 T PO ONCE A DAY, Disp: , Rfl: 0 .  baclofen (LIORESAL) 10 MG tablet, TAKE 1 TABLET BY MOUTH UP TO THREE TIMES DAILY AS NEEDED, Disp: 270 tablet, Rfl: 0 .  cetirizine (ZYRTEC) 10 MG tablet, Take 10 mg by mouth daily., Disp: , Rfl:  .  Cholecalciferol (VITAMIN D3 PO), Take 2,000 Units by mouth daily., Disp: , Rfl:  .  cyanocobalamin (,VITAMIN B-12,) 1000 MCG/ML injection, INJ 1 ML UTD Q 2 WEEKS, Disp: , Rfl: 5 .  dalfampridine (AMPYRA) 10 MG TB12, Take 1 tablet (10 mg total) by mouth 2 (two) times daily., Disp: 180 tablet, Rfl: 3 .  denosumab (PROLIA) 60 MG/ML SOLN injection, Inject 60 mg into the skin every 6 (six) months. Administer in upper arm, thigh, or abdomen,  Disp: , Rfl:  .  diazepam (VALIUM) 5 MG tablet, Take up to one a day, Disp: 30 tablet, Rfl: 2 .  DULoxetine (CYMBALTA) 60 MG capsule, Take 1 capsule (60 mg total) by mouth daily., Disp: 90 capsule, Rfl: 3 .  fluticasone (FLONASE) 50 MCG/ACT nasal spray, SHAKE LQ AND U 1 SPR IEN QD, Disp: , Rfl: 2 .  LORazepam (ATIVAN) 1 MG tablet, Take one to two before the MRi, Disp: 2 tablet, Rfl: 0 .  MELATONIN PO, Take by mouth., Disp: , Rfl:  .  multivitamin-iron-minerals-folic acid (  CENTRUM) chewable tablet, Chew 1 tablet by mouth daily., Disp: , Rfl:  .  rosuvastatin (CRESTOR) 20 MG tablet, Take 20 mg by mouth daily., Disp: , Rfl:  .  natalizumab (TYSABRI) 300 MG/15ML injection, Inject 300 mg into the vein every 30 (thirty) days., Disp: , Rfl:   PAST MEDICAL HISTORY: Past Medical History:  Diagnosis Date  . Chronic kidney disease   . Kidney stones   . Multiple sclerosis (HCC)   . Osteoporosis 2018  . Vision abnormalities     PAST SURGICAL HISTORY: Past Surgical History:  Procedure Laterality Date  . CHOLECYSTECTOMY, LAPAROSCOPIC    . LITHOTRIPSY    . WISDOM TOOTH EXTRACTION      FAMILY HISTORY: Family History  Problem Relation Age of Onset  . Breast cancer Mother   . Hypertension Mother   . Diabetes type II Mother   . Kidney disease Mother   . Congestive Heart Failure Mother   . Heart disease Father   . Hypertension Father   . Seizures Father   . Lymphoma Father   . Epilepsy Father     SOCIAL HISTORY:  Social History   Socioeconomic History  . Marital status: Married    Spouse name: Not on file  . Number of children: Not on file  . Years of education: Not on file  . Highest education level: Not on file  Occupational History  . Not on file  Social Needs  . Financial resource strain: Not on file  . Food insecurity:    Worry: Not on file    Inability: Not on file  . Transportation needs:    Medical: Not on file    Non-medical: Not on file  Tobacco Use  . Smoking  status: Never Smoker  . Smokeless tobacco: Never Used  Substance and Sexual Activity  . Alcohol use: No    Alcohol/week: 0.0 oz  . Drug use: No  . Sexual activity: Not on file  Lifestyle  . Physical activity:    Days per week: Not on file    Minutes per session: Not on file  . Stress: Not on file  Relationships  . Social connections:    Talks on phone: Not on file    Gets together: Not on file    Attends religious service: Not on file    Active member of club or organization: Not on file    Attends meetings of clubs or organizations: Not on file    Relationship status: Not on file  . Intimate partner violence:    Fear of current or ex partner: Not on file    Emotionally abused: Not on file    Physically abused: Not on file    Forced sexual activity: Not on file  Other Topics Concern  . Not on file  Social History Narrative  . Not on file     PHYSICAL EXAM  Vitals:   04/06/18 1039  BP: 122/71  Pulse: 99  Weight: 129 lb (58.5 kg)  Height: 4\' 11"  (1.499 m)    Body mass index is 26.05 kg/m.   General: The patient is well-developed and well-nourished and in no acute distress  Neurologic Exam  Mental status: The patient is alert and oriented x 3 at the time of the examination. The patient has apparent normal recent and remote memory, with an apparently normal attention span and concentration ability.   Speech is normal.  Cranial nerves: Extraocular movements are normal.  Facial strength and sensation is normal.  Trapezius strength is normal.  The tongue is midline, and the patient has symmetric elevation of the soft palate. No obvious hearing deficits are noted.  Motor:  Muscle bulk is normal.   Muscle tone is increased in the legs, left more than right. There is slight increased muscle tone in the left arm. Strength is 5/5 on the right. Strength is 4++/5 in the left arm and 4 - to 4/5 in the left leg.   Sensory: She has normal symmetric sensation to touch,  temperature and vibration..  Coordination: Cerebellar testing shows reduced finger-nose-finger on the left.  There is poor heel-to-shin on the left and mildly reduced heel-to-shin on the right  Gait and station: Station is normal.   Her gait is spastic. There is a left foot drop. She cannot do a tandem gait without holding with unilateral support. Romberg is negative.  Reflexes: Deep tendon reflexes are symmetric and increased in the left arm and both legs.  Reflexes show spread at the knees.    DIAGNOSTIC DATA (LABS, IMAGING, TESTING) - I reviewed patient records, labs, notes, testing and imaging myself where available.      ASSESSMENT AND PLAN  Multiple sclerosis (HCC) - Plan: MR BRAIN W WO CONTRAST, Stratify JCV Antibody Test ([facility]), CBC with Differential/Platelet  Spastic gait  Depression, unspecified depression type  Other fatigue  Urinary frequency    1.   Continue Tysabri 300 mg every 4 weeks.  I will recheck his JCV antibody and CBC today.  We will check an MRI of the brain to make sure that she is not having subclinical progression. If present, we will need to consider a change in therapy. 2.   Stay active and exercise as tolerated 3.   Continue Ampyra 10 mg po bid.   , if gait worsens, consider an AFO (left). 4.   Continue Cymbalta for mood.    Today, I have called in a prescription for Wellbutrin.  However, as she is also on Ampyra the combination might increase the risk of seizure so I would prefer her not to combine the medications.  The pharmacy was called to cancel the prescription.  Take as needed Valium for spasticity or anxiety.   5.   She will return to see me in about 6 months.   Silvester Reierson A. Epimenio Foot, MD, PhD 04/06/2018, 11:15 AM Certified in Neurology, Clinical Neurophysiology, Sleep Medicine, Pain Medicine and Neuroimaging  [facility] [address], [address] [place], [place] [address] [phone]

## 2018-04-07 ENCOUNTER — Encounter: Payer: Self-pay | Admitting: *Deleted

## 2018-04-07 LAB — CBC WITH DIFFERENTIAL/PLATELET
Basophils Absolute: 0 10*3/uL (ref 0.0–0.2)
Basos: 1 %
EOS (ABSOLUTE): 0.1 10*3/uL (ref 0.0–0.4)
EOS: 2 %
Hematocrit: 42.5 % (ref 34.0–46.6)
Hemoglobin: 14.5 g/dL (ref 11.1–15.9)
IMMATURE GRANS (ABS): 0 10*3/uL (ref 0.0–0.1)
IMMATURE GRANULOCYTES: 0 %
LYMPHS: 37 %
Lymphocytes Absolute: 2.2 10*3/uL (ref 0.7–3.1)
MCH: 28.9 pg (ref 26.6–33.0)
MCHC: 34.1 g/dL (ref 31.5–35.7)
MCV: 85 fL (ref 79–97)
Monocytes Absolute: 0.7 10*3/uL (ref 0.1–0.9)
Monocytes: 12 %
NEUTROS PCT: 48 %
Neutrophils Absolute: 2.9 10*3/uL (ref 1.4–7.0)
PLATELETS: 228 10*3/uL (ref 150–450)
RBC: 5.01 x10E6/uL (ref 3.77–5.28)
RDW: 16.2 % — ABNORMAL HIGH (ref 12.3–15.4)
WBC: 6 10*3/uL (ref 3.4–10.8)

## 2018-04-07 MED ORDER — BUSPIRONE HCL 15 MG PO TABS: 15.0000 mg | ORAL_TABLET | Freq: Two times a day (BID) | ORAL | 3 refills | Status: DC

## 2018-04-07 NOTE — Telephone Encounter (Signed)
Spoke to Cindy Frederick who confirmed she did receive Dr. Bonnita Hollow message.  She would like an alternate anti-depressant sent to the pharmacy.

## 2018-04-07 NOTE — Telephone Encounter (Signed)
Per vo by Dr. Epimenio Foot, send in Buspar 15mg , one tablet BID.  Returned call to Cindy Frederick to make her aware.  She would like a 90-day supply sent to [facility].

## 2018-04-07 NOTE — Telephone Encounter (Signed)
Cindy Frederick sent 90 day supply of Buspar to [facility], per pt's request/fim

## 2018-04-07 NOTE — Addendum Note (Signed)
Addended by: Lilla Shook on: 04/07/2018 09:58 AM   Modules accepted: Orders

## 2018-04-14 ENCOUNTER — Encounter: Payer: Self-pay | Admitting: *Deleted

## 2018-04-17 ENCOUNTER — Ambulatory Visit
Admission: RE | Admit: 2018-04-17 | Discharge: 2018-04-17 | Disposition: A | Discharge status: Home / Self Care | Payer: Medicare Other | Source: Ambulatory Visit | Attending: Neurology | Admitting: Neurology

## 2018-04-17 DIAGNOSIS — G35 Multiple sclerosis: Secondary | ICD-10-CM

## 2018-04-17 MED ORDER — GADOBENATE DIMEGLUMINE 529 MG/ML IV SOLN
10.0000 mL | Freq: Once | INTRAVENOUS | Status: AC | PRN
  Administered 2018-04-17: 10 mL via INTRAVENOUS

## 2018-04-19 ENCOUNTER — Telehealth: Payer: Self-pay | Admitting: *Deleted

## 2018-04-19 NOTE — Telephone Encounter (Signed)
-----   Message from Asa Lente, MD sent at 04/17/2018  7:20 PM EDT ----- Please let her know that the MRI of the brain did not show any new lesions.

## 2018-04-19 NOTE — Telephone Encounter (Signed)
Spoke with Daiza and reviewed below MRI results.  She verbalized understanding of same/fim

## 2018-04-26 ENCOUNTER — Telehealth: Payer: Self-pay | Admitting: *Deleted

## 2018-04-26 NOTE — Telephone Encounter (Signed)
LMOM.  Cindy Frederick called last wk. to check JCV ab results.  JCV ab did increase slightly from 0.34 to 0.44.  Since this is still a low positive result, Dr. Epimenio Foot is comfortable with Cindy Frederick continuing Tysabri, but in order to further decrease the risk of PML, he can push her infusions from Q4wks to Q5wks.  I already let Cindy Frederick in the infusion suite know, so she can schedule Cindy Frederick's infusions accordingly.  She does not need tor return this call unless she has questions/fim

## 2018-04-26 NOTE — Telephone Encounter (Signed)
Spoke with Cindy Frederick and explained rationale for moving Tysabri from every 4 wks to every 5 wks.  She verbalized understanding of same and is agreeable/fim

## 2018-04-26 NOTE — Telephone Encounter (Signed)
Pt has returned the call to RN Faith because she has questions about message, please call

## 2018-04-27 ENCOUNTER — Encounter: Payer: Self-pay | Admitting: *Deleted

## 2018-05-17 ENCOUNTER — Telehealth: Payer: Self-pay | Admitting: Neurology

## 2018-05-17 NOTE — Telephone Encounter (Signed)
I spoke with Dr. Epimenio Foot and his recommendation is to take Melatonin 5mg  about an hour before bedtime and to try and wake up at the same time each morning and use lots of bright lights, ie. 10,000 lux light box, which can be purchased on [employer].

## 2018-05-17 NOTE — Telephone Encounter (Signed)
Pt has days and nights mixed up. She has tried melatonin, not playing on her phone. Pt is sleeping 10 hours of sleep a night. She is wanting to know what to do. Please call to advise

## 2018-05-18 NOTE — Telephone Encounter (Signed)
I spoke with patient and made her aware of Dr. Bonnita Hollow recommendations. I advised her to try and keep a schedule, going to bed at the same time and waking up at the same time each day.  She was greatly appreciative and will try to modify her sleeping habits and call back if no improvement or if she starts to feel worse.

## 2018-06-05 ENCOUNTER — Other Ambulatory Visit: Payer: Self-pay | Admitting: Neurology

## 2018-10-05 ENCOUNTER — Encounter: Payer: Self-pay | Admitting: *Deleted

## 2018-10-06 ENCOUNTER — Telehealth: Payer: Self-pay | Admitting: Neurology

## 2018-10-06 ENCOUNTER — Encounter: Payer: Self-pay | Admitting: Neurology

## 2018-10-06 ENCOUNTER — Ambulatory Visit: Payer: Medicare Other | Admitting: Neurology

## 2018-10-06 NOTE — Telephone Encounter (Signed)
Pt called for the infusion suite and call was transferred

## 2018-10-07 ENCOUNTER — Other Ambulatory Visit: Payer: Self-pay | Admitting: *Deleted

## 2018-10-07 ENCOUNTER — Telehealth: Payer: Self-pay | Admitting: *Deleted

## 2018-10-07 DIAGNOSIS — G35 Multiple sclerosis: Secondary | ICD-10-CM

## 2018-10-07 DIAGNOSIS — Z79899 Other long term (current) drug therapy: Secondary | ICD-10-CM

## 2018-10-07 NOTE — Addendum Note (Signed)
Addended by: Tamera StandsHINNANT, Deldrick Linch D on: 10/07/2018 02:00 PM   Modules accepted: Orders

## 2018-10-07 NOTE — Addendum Note (Signed)
Addended by: Tamera StandsHINNANT, Ladye Macnaughton D on: 10/07/2018 01:35 PM   Modules accepted: Orders

## 2018-10-07 NOTE — Addendum Note (Signed)
Addended by: Tamera Stands D on: 10/07/2018 01:36 PM   Modules accepted: Orders

## 2018-10-07 NOTE — Telephone Encounter (Signed)
Unable to infuse Tysabri today due to poor venous access.  This has been an ongoing problem; increasing difficulty obtaining IV access for monthly Tysabri infusions.  Total 4 sticks today by 3 different experienced IV nurses. I spoke with Dr. Epimenio Foot and this is the plan going forward:   Pt's JCV ab is low positive. Will recheck her JCV today. If it has not increased, pt. will stay on Tysabri every 5 wks and we will refer her to surgery for [facility] placement.  Note: Pt. wishes to stay on Tysabri. She is aware of low positive JCV status, and that this increases her PML risk. As long as JCV ab does not increase further, Dr. Epimenio Foot is comfortable with her continuing Tysabri every 5 wks, instead of every 4 wks, but would like to monitor JCV ab status more closely--every 3 mos. instead of every 6 mos.   However, if JCV has increased further, will give appt. with  Dr. Epimenio Foot to  discuss other treatment options with her, both IV and oral. If she opts to start another IV dmt, will send her to surgery for [facility] placement. If she opts for an oral dmt, no surgical referral needed.  Labs drawn today that would be needed to determine an alternative dmt, if needed/fim

## 2018-10-07 NOTE — Telephone Encounter (Signed)
Placed JCV lab in [facility] lock box for routine lab pick up.  

## 2018-10-15 LAB — COMPREHENSIVE METABOLIC PANEL
ALBUMIN: 4.9 g/dL — AB (ref 3.6–4.8)
ALT: 24 IU/L (ref 0–32)
AST: 21 IU/L (ref 0–40)
Albumin/Globulin Ratio: 2 (ref 1.2–2.2)
Alkaline Phosphatase: 79 IU/L (ref 39–117)
BILIRUBIN TOTAL: 0.7 mg/dL (ref 0.0–1.2)
BUN / CREAT RATIO: 20 (ref 12–28)
BUN: 13 mg/dL (ref 8–27)
CHLORIDE: 100 mmol/L (ref 96–106)
CO2: 23 mmol/L (ref 20–29)
Calcium: 9.3 mg/dL (ref 8.7–10.3)
Creatinine, Ser: 0.65 mg/dL (ref 0.57–1.00)
GFR calc Af Amer: 112 mL/min/{1.73_m2} (ref >59)
GFR calc non Af Amer: 97 mL/min/{1.73_m2} (ref >59)
GLUCOSE: 76 mg/dL (ref 65–99)
Globulin, Total: 2.5 g/dL (ref 1.5–4.5)
Potassium: 4 mmol/L (ref 3.5–5.2)
SODIUM: 136 mmol/L (ref 134–144)
Total Protein: 7.4 g/dL (ref 6.0–8.5)

## 2018-10-15 LAB — QUANTIFERON-TB GOLD PLUS
QuantiFERON Mitogen Value: >10 IU/mL
QuantiFERON Nil Value: 0.08 IU/mL
QuantiFERON TB1 Ag Value: 0.07 IU/mL
QuantiFERON TB2 Ag Value: 0.09 IU/mL
QuantiFERON-TB Gold Plus: NEGATIVE

## 2018-10-15 LAB — HEPATITIS B SURFACE ANTIGEN: Hepatitis B Surface Ag: NEGATIVE

## 2018-10-15 LAB — HEPATITIS B CORE ANTIBODY, TOTAL: Hep B Core Total Ab: NEGATIVE

## 2018-10-15 LAB — HEPATIC FUNCTION PANEL: Bilirubin, Direct: 0.2 mg/dL (ref 0.00–0.40)

## 2018-10-15 LAB — CBC WITH DIFFERENTIAL/PLATELET
Basophils Absolute: 0 10*3/uL (ref 0.0–0.2)
Basos: 1 %
EOS (ABSOLUTE): 0.2 10*3/uL (ref 0.0–0.4)
Eos: 3 %
Hematocrit: 44.5 % (ref 34.0–46.6)
Hemoglobin: 14.6 g/dL (ref 11.1–15.9)
IMMATURE GRANULOCYTES: 0 %
Immature Grans (Abs): 0 10*3/uL (ref 0.0–0.1)
LYMPHS ABS: 1.8 10*3/uL (ref 0.7–3.1)
Lymphs: 31 %
MCH: 28.3 pg (ref 26.6–33.0)
MCHC: 32.8 g/dL (ref 31.5–35.7)
MCV: 86 fL (ref 79–97)
MONOS ABS: 0.7 10*3/uL (ref 0.1–0.9)
Monocytes: 12 %
NEUTROS PCT: 53 %
Neutrophils Absolute: 3.2 10*3/uL (ref 1.4–7.0)
Platelets: 272 10*3/uL (ref 150–450)
RBC: 5.15 x10E6/uL (ref 3.77–5.28)
RDW: 13.6 % (ref 12.3–15.4)
WBC: 6 10*3/uL (ref 3.4–10.8)

## 2018-10-15 LAB — HIV ANTIBODY (ROUTINE TESTING W REFLEX): HIV Screen 4th Generation wRfx: NONREACTIVE

## 2018-10-15 LAB — HEPATITIS B SURFACE ANTIBODY,QUALITATIVE: HEP B SURFACE AB, QUAL: NONREACTIVE

## 2018-10-18 NOTE — Telephone Encounter (Signed)
JCV drawn on 10/07/18 indeterminate. Titer: 0.40. Inhibition assay: positive. Given to Dr. Epimenio Foot to review.

## 2018-10-25 ENCOUNTER — Telehealth: Payer: Self-pay | Admitting: *Deleted

## 2018-10-25 NOTE — Telephone Encounter (Signed)
-----   Message from Suanne Marker, MD sent at 10/25/2018 12:22 AM EST ----- Unremarkable labs. Further plan per Dr. Epimenio Foot. -VRP

## 2018-10-25 NOTE — Telephone Encounter (Signed)
Called and spoke with pt about results per Dr. Marjory Lies note. She verbalized understanding. She has appt with Dr. Epimenio Foot tomorrow at 1:30pm

## 2018-10-26 ENCOUNTER — Other Ambulatory Visit: Payer: Self-pay | Admitting: *Deleted

## 2018-10-26 ENCOUNTER — Encounter: Payer: Self-pay | Admitting: Neurology

## 2018-10-26 ENCOUNTER — Ambulatory Visit: Payer: Medicare Other | Admitting: Neurology

## 2018-10-26 VITALS — BP 126/85 | HR 89 | Ht 59.0 in | Wt 132.5 lb

## 2018-10-26 DIAGNOSIS — G35 Multiple sclerosis: Secondary | ICD-10-CM

## 2018-10-26 DIAGNOSIS — I878 Other specified disorders of veins: Secondary | ICD-10-CM

## 2018-10-26 DIAGNOSIS — R350 Frequency of micturition: Secondary | ICD-10-CM

## 2018-10-26 DIAGNOSIS — R261 Paralytic gait: Secondary | ICD-10-CM | POA: Diagnosis not present

## 2018-10-26 DIAGNOSIS — R5383 Other fatigue: Secondary | ICD-10-CM

## 2018-10-26 DIAGNOSIS — Z79899 Other long term (current) drug therapy: Secondary | ICD-10-CM

## 2018-10-26 NOTE — Progress Notes (Signed)
[facility]  PATIENT: Cindy Frederick DOB: 03-Sep-1956  REFERRING DOCTOR OR PCP:  Dr. Ardelle ParkHaque (PCP),   _________________________________   HISTORICAL  CHIEF COMPLAINT:  Chief Complaint  Patient presents with  . Follow-up    RM 13, alone. Last seen 04/06/18. She is here to discuss having port-a-cath placed for Tysabri infusion d/t poor venous access.   . Multiple Sclerosis    On Tysabri. Last infusion: 5 weeks prior to Christmas. Last JCV ab drawn on 10/07/18 indeterminate. Titer: 0.40. Inhibition assay: positive. Previous JCV done was on 04/14/18 JCV ab Positive 0.41.  . Gait Problem    States Ampyra helps a lot with walking. Has had no falls since last seen.    HISTORY OF PRESENT ILLNESS:  Cindy Frederick is a 63 yo woman with multiple sclerosis.   Update 10/26/2018: She was unable to get her last Tysabri infusion due to poor access.    She has been on Tysabri x 6 years and has tolerated it well.   She was on Betaseron in the past.   We discussed a port-a-cath to help access.    Her main problem with her MS has been gait issues.    She has had only one fall (tripped on a small step).   Her left leg is worse than her right leg.    Ampyra has helped the gait some.     She has mild urgency but no urinary incontinence.    Vision is fine.     Update 04/06/2018: For the most part, her MS has been stable.    She has been on Tysabri.   She denies any exacerbation.   She tolerates it well.     Her main problem is left sided (leg > arm) weakness and spasticity.    She gets cramps in both legs.    She uses a cane.    Ampyra has helped her some.     Bladder function is the same (mild urge incontinence).   Vision is fine.   She has had some depression since her mom dies recently.    There are family issues as Cindy BraunKaren is the executor of the will.     She notes more fatigue.     She notes some apathy (usually enjoys yard-work)   She is on Cymbalta and felt she did well until 3 weeks ago.  She also takes valium prn.     She now has more tearfulness.    She sleeps ok.   Cognition is fine.   She has fatigue, helped by B12 and amantadine.     She has some mild reduced mood with her mom's death last year.   She has some anxiety but this is not a severe issue.     She has not taken any valium recently.   She feels cognition is doing well.    Update 10/05/2017: Her MS is mostly stable.   She is on Tysabri and has not had any exacerbation.  Her next infusion is the 26th of December. She has  weakness, spasticity and clumsiness in her left leg and mild weakness in the left arm.   Ampyra has helped her gait some.     She uses a cane.    Bladder function is unchanged with mild leakage at times..   Vision is stable.     Fatigue has done ok (mild but unchanged).    She is usually able to finish all of her tasks.  Amantadine and B12 have her some.   She has had more family stress (son on substances and having other issues, mother lives with her and she watches grandchild a lot).   Mood has ben mostly stable and she takes valium as needed.  Cognition is fine.   From 04/06/2017: MS:   She is currently on Tysabri and has been so for the past 5 or 6 years. She tolerates it well. She has been borderline positive for the JCV antibody on multiple tests. Her last titer was 0.25 six months ago.    Gait/strength/sensation: Her gait is stable. She has sided leg and arm weakness and spasticity. She has received some benefit from Ampyra.    She gets spasticity especially in the left leg. When the spasms are worse she will take a Valium with benefit. She also rarely takes a Valium if she has more anxiety.  Spasticity is worse when she feels stressed.    Bladder/bowel: She urinary frequency and urgency.   She was once on Vesicare but it did not help much. . She has a colostomy since age 75 because of ulcerative colitis.  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  Vision is worse if  tired or stressed.   Mood/cognition: She has mild depression and mild anxiety.    Her mood does better with Cymbalta.  She gets stressed some days and that increases her anxiety.   Her mom is elderly with health issues.   She denies any significant difficulties with cognition.  Fatigue/sleep: She has fatigue, worse when she is hot .   Amantadine has helped the physical fatigue. She has sleep onset insomnia helped by melatonin most nights though about once a week, she still has poor. She sleeps well almost every night now.  MS history:    She presented in 1991 with right optic neuritis. She was treated with IV steroids and improved. A year later, she had left optic neuritis and was again treated with IV steroids. She improved that time as well. Around 1996 she had the onset of numbness in her legs. She was started on Avonex. However, she had trouble tolerating that and one year later switched to Betaseron. She stayed on Betaseron until 2012. In the late 1990's  and the 2000's she had progressive gait disability with left leg weakness and spasticity. I placed her on Tysabri when she transferred to my care in 2012.  She tolerates it well and has had no exacerbations while on it.     REVIEW OF SYSTEMS: Constitutional: No fevers, chills, sweats, or change in appetite.   She notes fatigue.   Some insomnia Eyes: No visual changes, double vision, eye pain Ear, nose and throat: No hearing loss, ear pain, nasal congestion, sore throat Cardiovascular: No chest pain, palpitations Respiratory: No shortness of breath at rest or with exertion.   No wheezes GastrointestinaI: No nausea, vomiting, diarrhea, abdominal pain, fecal incontinence Genitourinary: Notes frequency but no incontinence.  No nocturia. Musculoskeletal: No neck pain, back pain Integumentary: No rash, pruritus, skin lesions Neurological: as above Psychiatric: Mild depression at this time.  No anxiety Endocrine: No palpitations, diaphoresis,  change in appetite, change in weigh or increased thirst Hematologic/Lymphatic: No anemia, purpura, petechiae. Allergic/Immunologic: No itchy/runny eyes, nasal congestion, recent allergic reactions, rashes  ALLERGIES: Allergies  Allergen Reactions  . Sulfa Antibiotics Hives  . Betadine [Povidone Iodine] Rash    HOME MEDICATIONS:  Current Outpatient Medications:  .  amLODipine (NORVASC)  5 MG tablet, TK 1 T PO ONCE A DAY, Disp: , Rfl: 0 .  baclofen (LIORESAL) 10 MG tablet, TAKE 1 TABLET BY MOUTH UP TO THREE TIMES DAILY AS NEEDED, Disp: 270 tablet, Rfl: 0 .  cetirizine (ZYRTEC) 10 MG tablet, Take 10 mg by mouth daily., Disp: , Rfl:  .  Cholecalciferol (VITAMIN D3 PO), Take 2,000 Units by mouth daily., Disp: , Rfl:  .  cyanocobalamin (,VITAMIN B-12,) 1000 MCG/ML injection, INJ 1 ML UTD Q 2 WEEKS, Disp: , Rfl: 5 .  dalfampridine (AMPYRA) 10 MG TB12, Take 1 tablet (10 mg total) by mouth 2 (two) times daily., Disp: 180 tablet, Rfl: 3 .  denosumab (PROLIA) 60 MG/ML SOLN injection, Inject 60 mg into the skin every 6 (six) months. Administer in upper arm, thigh, or abdomen, Disp: , Rfl:  .  diazepam (VALIUM) 5 MG tablet, Take up to one a day (Patient taking differently: Take 5 mg by mouth as needed. Take up to one a day), Disp: 30 tablet, Rfl: 2 .  DULoxetine (CYMBALTA) 60 MG capsule, TAKE 1 CAPSULE(60 MG) BY MOUTH DAILY, Disp: 90 capsule, Rfl: 0 .  fluticasone (FLONASE) 50 MCG/ACT nasal spray, SHAKE LQ AND U 1 SPR IEN QD, Disp: , Rfl: 2 .  LORazepam (ATIVAN) 1 MG tablet, Take one to two before the MRi, Disp: 2 tablet, Rfl: 0 .  MELATONIN PO, Take 5 mg by mouth at bedtime. , Disp: , Rfl:  .  multivitamin-iron-minerals-folic acid (CENTRUM) chewable tablet, Chew 1 tablet by mouth daily., Disp: , Rfl:  .  rosuvastatin (CRESTOR) 20 MG tablet, Take 20 mg by mouth daily., Disp: , Rfl:  .  natalizumab (TYSABRI) 300 MG/15ML injection, Inject 300 mg into the vein every 30 (thirty) days., Disp: , Rfl:    PAST MEDICAL HISTORY: Past Medical History:  Diagnosis Date  . Chronic kidney disease   . Kidney stones   . Multiple sclerosis (HCC)   . Osteoporosis 2018  . Vision abnormalities     PAST SURGICAL HISTORY: Past Surgical History:  Procedure Laterality Date  . CHOLECYSTECTOMY, LAPAROSCOPIC    . LITHOTRIPSY    . WISDOM TOOTH EXTRACTION      FAMILY HISTORY: Family History  Problem Relation Age of Onset  . Breast cancer Mother   . Hypertension Mother   . Diabetes type II Mother   . Kidney disease Mother   . Congestive Heart Failure Mother   . Heart disease Father   . Hypertension Father   . Seizures Father   . Lymphoma Father   . Epilepsy Father     SOCIAL HISTORY:  Social History   Socioeconomic History  . Marital status: Married    Spouse name: Not on file  . Number of children: Not on file  . Years of education: Not on file  . Highest education level: Not on file  Occupational History  . Not on file  Social Needs  . Financial resource strain: Not on file  . Food insecurity:    Worry: Not on file    Inability: Not on file  . Transportation needs:    Medical: Not on file    Non-medical: Not on file  Tobacco Use  . Smoking status: Never Smoker  . Smokeless tobacco: Never Used  Substance and Sexual Activity  . Alcohol use: No    Alcohol/week: 0.0 standard drinks  . Drug use: No  . Sexual activity: Not on file  Lifestyle  . Physical activity:  Days per week: Not on file    Minutes per session: Not on file  . Stress: Not on file  Relationships  . Social connections:    Talks on phone: Not on file    Gets together: Not on file    Attends religious service: Not on file    Active member of club or organization: Not on file    Attends meetings of clubs or organizations: Not on file    Relationship status: Not on file  . Intimate partner violence:    Fear of current or ex partner: Not on file    Emotionally abused: Not on file    Physically  abused: Not on file    Forced sexual activity: Not on file  Other Topics Concern  . Not on file  Social History Narrative  . Not on file     PHYSICAL EXAM  Vitals:   10/26/18 1329  BP: 126/85  Pulse: 89  Weight: 132 lb 8 oz (60.1 kg)  Height: 4\' 11"  (1.499 m)    Body mass index is 26.76 kg/m.   General: The patient is well-developed and well-nourished and in no acute distress  Neurologic Exam  Mental status: The patient is alert and oriented x 3 at the time of the examination. The patient has apparent normal recent and remote memory, with an apparently normal attention span and concentration ability.   Speech is normal.  Cranial nerves: Extraocular movements are normal.  She has reduced OS color saturation but symmetric acuity.   Facial strength and sensation is normal.  Trapezius strength is normal.  The tongue is midline, and the patient has symmetric elevation of the soft palate. No obvious hearing deficits are noted.  Motor:  Muscle bulk is normal.   Muscle tone is increased in the legs, left more than right. There is slight increased muscle tone in the left arm.  Strength is 5/5 in the right arm and leg.  Strength was 5/5 in the left arm though she had reduced left arm rapid rotating movements.  Strength was 4+/5 in the left leg.  Sensory: She has normal symmetric sensation to touch, temperature and vibration..  Coordination: Cerebellar testing shows reduced finger-nose-finger on the left.  There is poor heel-to-shin on the left and mildly reduced heel-to-shin on the right  Gait and station: Station is normal.   Her gait is spastic. She has left foot drop and cannot do a tandem gait.   . Romberg is negative.  Reflexes: Deep tendon reflexes are symmetric and increased in the left arm and both legs.  Reflexes show spread at the knees.    DIAGNOSTIC DATA (LABS, IMAGING, TESTING) - I reviewed patient records, labs, notes, testing and imaging myself where  available.      ASSESSMENT AND PLAN  Multiple sclerosis (HCC)  Spastic gait  Other fatigue  Urinary frequency   1.   Continue Tysabri 300 mg every 4 weeks.  We need to set her up for a Port-A-Cath procedure to help IV access for her Tysabri 2.   Stay active and exercise as tolerated 3.   Continue Ampyra 10 mg po bid.  Consider an AFO (left) if gait worsens.   4.   Continue Cymbalta for mood.     5.   She will return to see me in about 6 months.   Stockton Nunley A. Epimenio FootSater, MD, PhD 10/26/2018, 1:49 PM Certified in Neurology, Clinical Neurophysiology, Sleep Medicine, Pain Medicine and Neuroimaging  [facility]  [address], [place], [place] [address] [phone]

## 2018-11-09 ENCOUNTER — Telehealth: Payer: Self-pay | Admitting: Neurology

## 2018-11-09 NOTE — Telephone Encounter (Signed)
Took call from [place] in phone room. I spoke with pt. She has appt tomorrow for Tysabri around 1030am. She is worried about getting stuck tomorrow for IV. Recommended she still come for appt. Cannot guarantee to get IV on first stick but better to try and get her the treatment. She is aware [name] is out.  She has appt with surgeon 11/16/18 for port-a-cath. She was offered sooner appt but she declined d/t not being able to go that day. I recommended she f/u daily to check on cx. She verbalized understanding and appreciation.

## 2018-11-09 NOTE — Telephone Encounter (Signed)
pt has called for the intrafusion suite, call transferred °

## 2018-11-22 ENCOUNTER — Telehealth: Payer: Self-pay | Admitting: *Deleted

## 2018-11-22 NOTE — Telephone Encounter (Signed)
Pt here today for Tysabri infusion. She verified she has port-a-cath placed.

## 2018-11-22 NOTE — Telephone Encounter (Signed)
Spoke w/ Dr. Epimenio Foot. He would like her to go to q5 weeks of Tysabri since JCV positive, titer: 0.40. I gave completed/signed orders to intrafusion Almira Coaster).

## 2018-12-07 ENCOUNTER — Telehealth: Payer: Self-pay | Admitting: Neurology

## 2018-12-07 NOTE — Telephone Encounter (Signed)
Pt transferred to infusion °

## 2018-12-23 ENCOUNTER — Telehealth: Payer: Self-pay | Admitting: *Deleted

## 2018-12-23 MED ORDER — DALFAMPRIDINE ER 10 MG PO TB12: 10.0000 mg | ORAL_TABLET | Freq: Two times a day (BID) | ORAL | 3 refills | Status: DC

## 2018-12-23 NOTE — Telephone Encounter (Signed)
Ampyra escribed to [facility] in response to faxed request from them/fim

## 2019-01-11 ENCOUNTER — Telehealth: Payer: Self-pay | Admitting: *Deleted

## 2019-01-11 ENCOUNTER — Other Ambulatory Visit: Payer: Self-pay | Admitting: *Deleted

## 2019-01-11 DIAGNOSIS — G35 Multiple sclerosis: Secondary | ICD-10-CM

## 2019-01-11 DIAGNOSIS — Z79899 Other long term (current) drug therapy: Secondary | ICD-10-CM

## 2019-01-11 NOTE — Telephone Encounter (Signed)
Called pt to make her aware she is due for repeat JCV lab. Dr. Epimenio Foot advised she can complete next time she is in for infusion. She is scheduled for 01/31/19. I placed future lab order for JCV.

## 2019-01-31 ENCOUNTER — Telehealth: Payer: Self-pay | Admitting: *Deleted

## 2019-01-31 ENCOUNTER — Telehealth: Payer: Self-pay | Admitting: Neurology

## 2019-01-31 NOTE — Telephone Encounter (Signed)
Pt called and states that she was sent by the mail order pharmacy the dalfampridine (AMPYRA) 10 MG TB12 in the Generic form and she has always taken the Brand. She started it 2 days ago with no symptoms so far but she would like to know if it will be ok for her to continue the Generic or do they have to send her the Brand. Please advise.

## 2019-01-31 NOTE — Telephone Encounter (Signed)
I called and spoke with patient. Advised ok to take generic. Asked her to call back if she has any changes. She verbalized understanding.

## 2019-01-31 NOTE — Telephone Encounter (Signed)
Pt was supposed to have Tysabri infusion today and get JCV lab.  I spoke with intrafusion who stated pt cx this am and r/s for 02/22/19 d/t concerns with covid-19.   I made Dr. Epimenio Foot aware. He states as long as she does not go longer than 2 months between infusions she will be okay. I relayed this to [facility]fusion.

## 2019-01-31 NOTE — Telephone Encounter (Signed)
I called and LMVM for pt that usually insurance wants you to do generic first then if problems then will prescribed BRAND.  I see that amypra went generic 07-10-2017.  From our records does not show BMN.  Will check with Dr. Epimenio Foot to verify ok then let you know.

## 2019-03-08 ENCOUNTER — Telehealth: Payer: Self-pay | Admitting: *Deleted

## 2019-03-08 ENCOUNTER — Other Ambulatory Visit (INDEPENDENT_AMBULATORY_CARE_PROVIDER_SITE_OTHER): Payer: Self-pay

## 2019-03-08 ENCOUNTER — Other Ambulatory Visit: Payer: Self-pay

## 2019-03-08 DIAGNOSIS — Z0289 Encounter for other administrative examinations: Secondary | ICD-10-CM

## 2019-03-08 NOTE — Telephone Encounter (Signed)
Placed JCV lab in [facility] lock box for routine lab pick up. Results pending. 

## 2019-03-15 NOTE — Telephone Encounter (Signed)
JCV ab drawn on 03/08/19 positive, index value 0.42. Gave to Dr. Epimenio Foot to review.

## 2019-03-28 ENCOUNTER — Telehealth: Payer: Self-pay | Admitting: *Deleted

## 2019-03-28 NOTE — Telephone Encounter (Signed)
Received fax notification from [facility] prescribing program that pt re-auth from 03/28/19-10/27/19. Patient enrollment number: [id number]. Account: [facility]. Site auth number: [id number].

## 2019-03-28 NOTE — Telephone Encounter (Signed)
Faxed completed/signed Tysabri pt status report and reauth questionnaire to [facility] at [phone]. Received confirmation.  

## 2019-03-31 NOTE — Telephone Encounter (Signed)
Took call from phone staff. Pt was asking what JCV results were. I relayed. She verbalized understanding.

## 2019-04-28 ENCOUNTER — Ambulatory Visit: Payer: Medicare Other | Admitting: Neurology

## 2019-05-23 ENCOUNTER — Telehealth: Payer: Self-pay | Admitting: *Deleted

## 2019-05-23 MED ORDER — MELOXICAM 7.5 MG PO TABS: 7.5000 mg | ORAL_TABLET | Freq: Every day | ORAL | 3 refills | Status: DC

## 2019-05-23 NOTE — Telephone Encounter (Signed)
Okay we can reschedule the Tysabri when her UTI is doing better.

## 2019-05-23 NOTE — Telephone Encounter (Signed)
Liane, RN with infusion suite spoke with pt. She advised pt was supposed to come for Tysabri infusion today but has kidney stones/possible UTI. Was going to be treated with Macrobid but she could not tolerate this in the past. She is going to contact her physician back to get started on something else. She rescheduled infusion for this Thursday. Liane advised pt complains of achy legs with change of weather but now in her feet when she lays down at night to go to sleep. Having difficulty sleeping. Pt wanting to know what Dr. Felecia Shelling would recommend.  Pt next f/u: 06/16/2019

## 2019-05-23 NOTE — Telephone Encounter (Signed)
Dr. Felecia Shelling- pt already rescheduled her Tysabri to Thursday. She was wanting to know what could be done about her leg/feet being achy

## 2019-05-23 NOTE — Telephone Encounter (Signed)
We can send in a prescription for meloxicam 7.5 mg.    #30 #3 refills 1 p.o. daily

## 2019-05-23 NOTE — Telephone Encounter (Signed)
Called and spoke with pt. She is agreeable to try meloxicam. I escribed to pharmacy. She will call back if any further questions/concerns.

## 2019-06-13 ENCOUNTER — Telehealth: Payer: Self-pay | Admitting: Neurology

## 2019-06-13 NOTE — Telephone Encounter (Signed)
Pt is calling in stating she needs to r/s due to bladder incontinence , offered pt first avail which is 10/21 she is requesting a call back to discuss

## 2019-06-13 NOTE — Telephone Encounter (Signed)
Called pt back. Rescheduled appt to Cindy L., NP (ok per Dr. Felecia Shelling) on 06/23/19 at 1pm with pt.  Pt recently had surgery to remove kidney stones, stlil has stent. Wanted to r/s to next week.

## 2019-06-16 ENCOUNTER — Ambulatory Visit: Payer: Medicare Other | Admitting: Neurology

## 2019-06-23 ENCOUNTER — Ambulatory Visit: Payer: Self-pay | Admitting: Family Medicine

## 2019-07-07 ENCOUNTER — Ambulatory Visit: Payer: Self-pay | Admitting: Family Medicine

## 2019-07-14 ENCOUNTER — Ambulatory Visit: Payer: Self-pay | Admitting: Family Medicine

## 2019-07-18 ENCOUNTER — Encounter: Payer: Self-pay | Admitting: Family Medicine

## 2019-07-20 ENCOUNTER — Telehealth: Payer: Self-pay

## 2019-07-20 ENCOUNTER — Ambulatory Visit: Payer: Medicare Other | Admitting: Family Medicine

## 2019-07-20 NOTE — Telephone Encounter (Signed)
Patient no show for appt today. 

## 2019-07-20 NOTE — Progress Notes (Deleted)
PATIENT: Cindy Frederick DOB: 10-30-1955  REASON FOR VISIT: follow up HISTORY FROM: patient  No chief complaint on file.    HISTORY OF PRESENT ILLNESS: Today 07/20/19 Cindy Frederick is a 63 y.o. female here today for follow up of MS. She continues Tysabri infusions. Last infusion was . She has had trouble with kidney stones requiring lithotripsy.    HISTORY: (copied from Dr Bonnita Hollow note on 10/26/2018)  Cindy Frederick is a 63 yo woman with multiple sclerosis.   Update 10/26/2018: She was unable to get her last Tysabri infusion due to poor access.    She has been on Tysabri x 6 years and has tolerated it well.   She was on Betaseron in the past.   We discussed a port-a-cath to help access.    Her main problem with her MS has been gait issues.    She has had only one fall (tripped on a small step).   Her left leg is worse than her right leg.    Ampyra has helped the gait some.     She has mild urgency but no urinary incontinence.    Vision is fine.     Update 04/06/2018: For the most part, her MS has been stable.    She has been on Tysabri.   She denies any exacerbation.   She tolerates it well.     Her main problem is left sided (leg > arm) weakness and spasticity.    She gets cramps in both legs.    She uses a cane.    Ampyra has helped her some.     Bladder function is the same (mild urge incontinence).   Vision is fine.   She has had some depression since her mom dies recently.    There are family issues as Tareka is the executor of the will.     She notes more fatigue.     She notes some apathy (usually enjoys yard-work)   She is on Cymbalta and felt she did well until 3 weeks ago. She also takes valium prn.     She now has more tearfulness.    She sleeps ok.   Cognition is fine.   She has fatigue, helped by B12 and amantadine.     She has some mild reduced mood with her mom's death last year.   She has some anxiety but this is not a severe issue.     She has not taken any valium  recently.   She feels cognition is doing well.    Update 10/05/2017: Her MS is mostly stable.   She is on Tysabri and has not had any exacerbation.  Her next infusion is the 26th of December. She has  weakness, spasticity and clumsiness in her left leg and mild weakness in the left arm.   Ampyra has helped her gait some.     She uses a cane.    Bladder function is unchanged with mild leakage at times..   Vision is stable.     Fatigue has done ok (mild but unchanged).    She is usually able to finish all of her tasks.    Amantadine and B12 have her some.   She has had more family stress (son on substances and having other issues, mother lives with her and she watches grandchild a lot).   Mood has ben mostly stable and she takes valium as needed.  Cognition is fine.   From 04/06/2017:  MS:   She is currently on Tysabri and has been so for the past 5 or 6 years. She tolerates it well. She has been borderline positive for the JCV antibody on multiple tests. Her last titer was 0.25 six months ago.    Gait/strength/sensation: Her gait is stable. She has sided leg and arm weakness and spasticity. She has received some benefit from Ampyra.    She gets spasticity especially in the left leg. When the spasms are worse she will take a Valium with benefit. She also rarely takes a Valium if she has more anxiety.  Spasticity is worse when she feels stressed.    Bladder/bowel: She urinary frequency and urgency.   She was once on Vesicare but it did not help much. . She has a colostomy since age 63 because of ulcerative colitis.  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  Vision is worse if tired or stressed.   Mood/cognition: She has mild depression and mild anxiety.    Her mood does better with Cymbalta.  She gets stressed some days and that increases her anxiety.   Her mom is elderly with health issues.   She denies any significant difficulties with cognition.  Fatigue/sleep:  She has fatigue, worse when she is hot .   Amantadine has helped the physical fatigue. She has sleep onset insomnia helped by melatonin most nights though about once a week, she still has poor. She sleeps well almost every night now.  MS history:    She presented in 1991 with right optic neuritis. She was treated with IV steroids and improved. A year later, she had left optic neuritis and was again treated with IV steroids. She improved that time as well. Around 1996 she had the onset of numbness in her legs. She was started on Avonex. However, she had trouble tolerating that and one year later switched to Betaseron. She stayed on Betaseron until 2012. In the late 1990's  and the 2000's she had progressive gait disability with left leg weakness and spasticity. I placed her on Tysabri when she transferred to my care in 2012.  She tolerates it well and has had no exacerbations while on it.      REVIEW OF SYSTEMS: Out of a complete 14 system review of symptoms, the patient complains only of the following symptoms, and all other reviewed systems are negative.  ALLERGIES: Allergies  Allergen Reactions   Sulfa Antibiotics Hives   Betadine [Povidone Iodine] Rash    HOME MEDICATIONS: Outpatient Medications Prior to Visit  Medication Sig Dispense Refill   amLODipine (NORVASC) 5 MG tablet TK 1 T PO ONCE A DAY  0   baclofen (LIORESAL) 10 MG tablet TAKE 1 TABLET BY MOUTH UP TO THREE TIMES DAILY AS NEEDED 270 tablet 0   cetirizine (ZYRTEC) 10 MG tablet Take 10 mg by mouth daily.     Cholecalciferol (VITAMIN D3 PO) Take 2,000 Units by mouth daily.     cyanocobalamin (,VITAMIN B-12,) 1000 MCG/ML injection INJ 1 ML UTD Q 2 WEEKS  5   dalfampridine (AMPYRA) 10 MG TB12 Take 1 tablet (10 mg total) by mouth 2 (two) times daily. 180 tablet 3   denosumab (PROLIA) 60 MG/ML SOLN injection Inject 60 mg into the skin every 6 (six) months. Administer in upper arm, thigh, or abdomen     diazepam (VALIUM) 5 MG  tablet Take up to one a day (Patient taking differently: Take 5 mg by mouth  as needed. Take up to one a day) 30 tablet 2   DULoxetine (CYMBALTA) 60 MG capsule TAKE 1 CAPSULE(60 MG) BY MOUTH DAILY 90 capsule 0   fluticasone (FLONASE) 50 MCG/ACT nasal spray SHAKE LQ AND U 1 SPR IEN QD  2   LORazepam (ATIVAN) 1 MG tablet Take one to two before the MRi 2 tablet 0   MELATONIN PO Take 5 mg by mouth at bedtime.      meloxicam (MOBIC) 7.5 MG tablet Take 1 tablet (7.5 mg total) by mouth daily. 30 tablet 3   multivitamin-iron-minerals-folic acid (CENTRUM) chewable tablet Chew 1 tablet by mouth daily.     natalizumab (TYSABRI) 300 MG/15ML injection Inject 300 mg into the vein every 30 (thirty) days.     rosuvastatin (CRESTOR) 20 MG tablet Take 20 mg by mouth daily.     No facility-administered medications prior to visit.     PAST MEDICAL HISTORY: Past Medical History:  Diagnosis Date   Chronic kidney disease    Kidney stones    Multiple sclerosis ([place])    Osteoporosis 2018   Vision abnormalities     PAST SURGICAL HISTORY: Past Surgical History:  Procedure Laterality Date   CHOLECYSTECTOMY, LAPAROSCOPIC     LITHOTRIPSY     WISDOM TOOTH EXTRACTION      FAMILY HISTORY: Family History  Problem Relation Age of Onset   Breast cancer Mother    Hypertension Mother    Diabetes type II Mother    Kidney disease Mother    Congestive Heart Failure Mother    Heart disease Father    Hypertension Father    Seizures Father    Lymphoma Father    Epilepsy Father     SOCIAL HISTORY: Social History   Socioeconomic History   Marital status: Married    Spouse name: Not on file   Number of children: Not on file   Years of education: Not on file   Highest education level: Not on file  Occupational History   Not on file  Social Needs   Financial resource strain: Not on file   Food insecurity    Worry: Not on file    Inability: Not on file   Transportation  needs    Medical: Not on file    Non-medical: Not on file  Tobacco Use   Smoking status: Never Smoker   Smokeless tobacco: Never Used  Substance and Sexual Activity   Alcohol use: No    Alcohol/week: 0.0 standard drinks   Drug use: No   Sexual activity: Not on file  Lifestyle   Physical activity    Days per week: Not on file    Minutes per session: Not on file   Stress: Not on file  Relationships   Social connections    Talks on phone: Not on file    Gets together: Not on file    Attends religious service: Not on file    Active member of club or organization: Not on file    Attends meetings of clubs or organizations: Not on file    Relationship status: Not on file   Intimate partner violence    Fear of current or ex partner: Not on file    Emotionally abused: Not on file    Physically abused: Not on file    Forced sexual activity: Not on file  Other Topics Concern   Not on file  Social History Narrative   Not on file  PHYSICAL EXAM  There were no vitals filed for this visit. There is no height or weight on file to calculate BMI.  Generalized: Well developed, in no acute distress  Cardiology: normal rate and rhythm, no murmur noted Neurological examination  Mentation: Alert oriented to time, place, history taking. Follows all commands speech and language fluent Cranial nerve II-XII: Pupils were equal round reactive to light. Extraocular movements were full, visual field were full on confrontational test. Facial sensation and strength were normal. Uvula tongue midline. Head turning and shoulder shrug  were normal and symmetric. Motor: The motor testing reveals 5 over 5 strength of all 4 extremities. Good symmetric motor tone is noted throughout.  Sensory: Sensory testing is intact to soft touch on all 4 extremities. No evidence of extinction is noted.  Coordination: Cerebellar testing reveals good finger-nose-finger and heel-to-shin bilaterally.  Gait  and station: Gait is normal. Tandem gait is normal. Romberg is negative. No drift is seen.  Reflexes: Deep tendon reflexes are symmetric and normal bilaterally.   DIAGNOSTIC DATA (LABS, IMAGING, TESTING) - I reviewed patient records, labs, notes, testing and imaging myself where available.  No flowsheet data found.   Lab Results  Component Value Date   WBC 6.0 10/07/2018   HGB 14.6 10/07/2018   HCT 44.5 10/07/2018   MCV 86 10/07/2018   PLT 272 10/07/2018      Component Value Date/Time   NA 136 10/07/2018 1405   K 4.0 10/07/2018 1405   CL 100 10/07/2018 1405   CO2 23 10/07/2018 1405   GLUCOSE 76 10/07/2018 1405   BUN 13 10/07/2018 1405   CREATININE 0.65 10/07/2018 1405   CALCIUM 9.3 10/07/2018 1405   PROT 7.4 10/07/2018 1405   ALBUMIN 4.9 (H) 10/07/2018 1405   AST 21 10/07/2018 1405   ALT 24 10/07/2018 1405   ALKPHOS 79 10/07/2018 1405   BILITOT 0.7 10/07/2018 1405   GFRNONAA 97 10/07/2018 1405   GFRAA 112 10/07/2018 1405   No results found for: CHOL, HDL, LDLCALC, LDLDIRECT, TRIG, CHOLHDL No results found for: UKGU5K No results found for: VITAMINB12 No results found for: TSH     ASSESSMENT AND PLAN 63 y.o. year old female  has a past medical history of Chronic kidney disease, Kidney stones, Multiple sclerosis ([facility]), Osteoporosis (2018), and Vision abnormalities. here with ***  No diagnosis found.     No orders of the defined types were placed in this encounter.    No orders of the defined types were placed in this encounter.     I spent 15 minutes with the patient. 50% of this time was spent counseling and educating patient on plan of care and medications.    Shawnie Dapper, FNP-C 07/20/2019, 7:47 AM [facility] [address], [address] [place], [place] [address] [phone]

## 2019-07-21 ENCOUNTER — Telehealth: Payer: Self-pay | Admitting: *Deleted

## 2019-07-21 NOTE — Telephone Encounter (Signed)
Liane spoke with pt. Pt reports worsening memory issues and requested to be seen earlier. Scheduled earlier f/u with AL,NP 07/25/2019 at 2:30pm. Cx appt previously made for 08/22/19.

## 2019-07-25 ENCOUNTER — Encounter: Payer: Self-pay | Admitting: Family Medicine

## 2019-07-25 ENCOUNTER — Other Ambulatory Visit: Payer: Self-pay

## 2019-07-25 ENCOUNTER — Ambulatory Visit: Payer: Medicare Other | Admitting: Family Medicine

## 2019-07-25 VITALS — BP 127/77 | HR 83 | Temp 97.8°F | Ht 59.0 in | Wt 134.8 lb

## 2019-07-25 DIAGNOSIS — Z79899 Other long term (current) drug therapy: Secondary | ICD-10-CM

## 2019-07-25 DIAGNOSIS — G35 Multiple sclerosis: Secondary | ICD-10-CM

## 2019-07-25 DIAGNOSIS — F329 Major depressive disorder, single episode, unspecified: Secondary | ICD-10-CM

## 2019-07-25 DIAGNOSIS — F32A Depression, unspecified: Secondary | ICD-10-CM

## 2019-07-25 DIAGNOSIS — G4700 Insomnia, unspecified: Secondary | ICD-10-CM

## 2019-07-25 DIAGNOSIS — E559 Vitamin D deficiency, unspecified: Secondary | ICD-10-CM

## 2019-07-25 NOTE — Progress Notes (Signed)
I have read the note, and I agree with the clinical assessment and plan.  Kenyatte Gruber A. Jahari Billy, MD, PhD, FAAN Certified in Neurology, Clinical Neurophysiology, Sleep Medicine, Pain Medicine and Neuroimaging  [facility] [address], [address] [place], [place] [address] [phone]  

## 2019-07-25 NOTE — Patient Instructions (Addendum)
Continue current treatment regimen  We will update labs today  Consider grief counseling   Follow up with Dr Epimenio Foot in 6 months  Memory Compensation Strategies  1. Use "WARM" strategy.  W= write it down  A= associate it  R= repeat it  M= make a mental note  2.   You can keep a Glass blower/designer.  Use a 3-ring notebook with sections for the following: calendar, important names and phone numbers,  medications, doctors' names/phone numbers, lists/reminders, and a section to journal what you did  each day.   3.    Use a calendar to write appointments down.  4.    Write yourself a schedule for the day.  This can be placed on the calendar or in a separate section of the Memory Notebook.  Keeping a  regular schedule can help memory.  5.    Use medication organizer with sections for each day or morning/evening pills.  You may need help loading it  6.    Keep a basket, or pegboard by the door.  Place items that you need to take out with you in the basket or on the pegboard.  You may also want to  include a message board for reminders.  7.    Use sticky notes.  Place sticky notes with reminders in a place where the task is performed.  For example: " turn off the  stove" placed by the stove, "lock the door" placed on the door at eye level, " take your medications" on  the bathroom mirror or by the place where you normally take your medications.  8.    Use alarms/timers.  Use while cooking to remind yourself to check on food or as a reminder to take your medicine, or as a  reminder to make a call, or as a reminder to perform another task, etc.   Multiple Sclerosis Multiple sclerosis (MS) is a disease of the brain, spinal cord, and optic nerves (central nervous system). It causes the body's disease-fighting (immune) system to destroy the protective covering (myelin sheath) around nerves in the brain. When this happens, signals (nerve impulses) going to and from the brain and spinal cord do not  get sent properly or may not get sent at all. There are several types of MS:  Relapsing-remitting MS. This is the most common type. This causes sudden attacks of symptoms. After an attack, you may recover completely until the next attack, or some symptoms may remain permanently.  Secondary progressive MS. This usually develops after the onset of relapsing-remitting MS. Similar to relapsing-remitting MS, this type also causes sudden attacks of symptoms. Attacks may be less frequent, but symptoms slowly get worse (progress) over time.  Primary progressive MS. This causes symptoms that steadily progress over time. This type of MS does not cause sudden attacks of symptoms. The age of onset of MS varies, but it often develops between 35-44 years of age. MS is a lifelong (chronic) condition. There is no cure, but treatment can help slow down the progression of the disease. What are the causes? The cause of this condition is not known. What increases the risk? You are more likely to develop this condition if:  You are a woman.  You have a relative with MS. However, the condition is not passed from parent to child (inherited).  You have a lack (deficiency) of vitamin D.  You smoke. MS is more common in the [place] than in the [place].  What are the signs or symptoms? Relapsing-remitting and secondary progressive MS cause symptoms to occur in episodes or attacks that may last weeks to months. There may be long periods between attacks in which there are almost no symptoms. Primary progressive MS causes symptoms to steadily progress after they develop. Symptoms of MS vary because of the many different ways it affects the central nervous system. The main symptoms include:  Vision problems and eye pain.  Numbness.  Weakness.  Inability to move your arms, hands, feet, or legs (paralysis).  Balance problems.  Shaking that you cannot control (tremors).  Muscle  spasms.  Problems with thinking (cognitive changes). MS can also cause symptoms that are associated with the disease, but are not always the direct result of an MS attack. They may include:  Inability to control urination or bowel movements (incontinence).  Headaches.  Fatigue.  Inability to tolerate heat.  Emotional changes.  Depression.  Pain. How is this diagnosed? This condition is diagnosed based on:  Your symptoms.  A neurological exam. This involves checking central nervous system function, such as nerve function, reflexes, and coordination.  MRIs of the brain and spinal cord.  Lab tests, including a lumbar puncture that tests the fluid that surrounds the brain and spinal cord (cerebrospinal fluid).  Tests to measure the electrical activity of the brain in response to stimulation (evoked potentials). How is this treated? There is no cure for MS, but medicines can help decrease the number and frequency of attacks and help relieve nuisance symptoms. Treatment options may include:  Medicines that reduce the frequency of attacks. These medicines may be given by injection, by mouth (orally), or through an IV.  Medicines that reduce inflammation (steroids). These may provide short-term relief of symptoms.  Medicines to help control pain, depression, fatigue, or incontinence.  Vitamin D, if you have a deficiency.  Using devices to help you move around (assistive devices), such as braces, a cane, or a walker.  Physical therapy to strengthen and stretch your muscles.  Occupational therapy to help you with everyday tasks.  Alternative or complementary treatments such as exercise, massage, or acupuncture. Follow these instructions at home:  Take over-the-counter and prescription medicines only as told by your health care provider.  Do not drive or use heavy machinery while taking prescription pain medicine.  Use assistive devices as recommended by your physical  therapist or your health care provider.  Exercise as directed by your health care provider.  Return to your normal activities as told by your health care provider. Ask your health care provider what activities are safe for you.  Reach out for support. Share your feelings with friends, family, or a support group.  Keep all follow-up visits as told by your health care provider and therapists. This is important. Where to find more information  National Multiple Sclerosis Society: https://www.nationalmssociety.org Contact a health care provider if:  You feel depressed.  You develop new pain or numbness.  You have tremors.  You have problems with sexual function. Get help right away if:  You develop paralysis.  You develop numbness.  You have problems with your bladder or bowel function.  You develop double vision.  You lose vision in one or both eyes.  You develop suicidal thoughts.  You develop severe confusion. If you ever feel like you may hurt yourself or others, or have thoughts about taking your own life, get help right away. You can go to your nearest emergency department or call:  Your  local emergency services (911 in the [place]  A suicide crisis helpline, such as the [facility] at 412-658-9355. This is open 24 hours a day. Summary  Multiple sclerosis (MS) is a disease of the central nervous system that causes the body's immune system to destroy the protective covering (myelin sheath) around nerves in the brain.  There are 3 types of MS: relapsing-remitting, secondary progressive, and primary progressive. Relapsing-remitting and secondary progressive MS cause symptoms to occur in episodes or attacks that may last weeks to months. Primary progressive MS causes symptoms to steadily progress after they develop.  There is no cure for MS, but medicines can help decrease the number and frequency of attacks and help relieve nuisance symptoms.  Treatment may also include physical or occupational therapy.  If you develop numbness, paralysis, vision problems, or other neurological symptoms, get help right away. This information is not intended to replace advice given to you by your health care provider. Make sure you discuss any questions you have with your health care provider. Document Released: 10/03/2000 Document Revised: 09/18/2017 Document Reviewed: 12/15/2016 Elsevier Patient Education  2020 [employer].

## 2019-07-25 NOTE — Progress Notes (Signed)
PATIENT: Cindy Frederick  REASON FOR VISIT: follow up HISTORY FROM: patient  Chief Complaint  Patient presents with  . Follow-up    New Room, with young granddaughter.  Memory (worsening) "Concerned about with memory"     HISTORY OF PRESENT ILLNESS:  Today 07/25/19 Cindy Frederick is a 63 y.o. female here today for follow up for MS. She continues Tysabri infusions q5 weeks. Last infusion 07/04/19. Next infusion is scheduled for 10/19. Positive JCV, last checked in 09/2018. She was referred to general surgery for port-a-cath placement due to poor venous access. Placed in 10/2018. She notes more concerns with memory loss. She has more trouble with recalling information. She still grieves the loss of her mother. She is helping care for her 6620yr old granddaughter. She feels that her house is a mess due to caring for her granddaughter. She has history of anxiety/depression on duloxetine 60mg  daily. She is also taking Valium (usually 1/2 tablet) for muscle spasms and anxiety. She continues Ampyra 10mg  twice daily for gait and feels this has helped. She was having increased leg and feet aches and prescribed meloxicam but did not take this. She feels that these aches come and go but are better. She has ileostomy. She denies changes in bladder habits with exception of kidney stone in August.    HISTORY: (copied from Dr Bonnita HollowSater's note on 10/26/2018)  Cindy Frederick is a 63 yo woman with multiple sclerosis.   Update 10/26/2018: She was unable to get her last Tysabri infusion due to poor access.    She has been on Tysabri x 6 years and has tolerated it well.   She was on Betaseron in the past.   We discussed a port-a-cath to help access.    Her main problem with her MS has been gait issues.    She has had only one fall (tripped on a small step).   Her left leg is worse than her right leg.    Ampyra has helped the gait some.     She has mild urgency but no urinary incontinence.    Vision is  fine.     Update 04/06/2018: For the most part, her MS has been stable.    She has been on Tysabri.   She denies any exacerbation.   She tolerates it well.     Her main problem is left sided (leg > arm) weakness and spasticity.    She gets cramps in both legs.    She uses a cane.    Ampyra has helped her some.     Bladder function is the same (mild urge incontinence).   Vision is fine.   She has had some depression since her mom dies recently.    There are family issues as Cindy BraunKaren is the executor of the will.     She notes more fatigue.     She notes some apathy (usually enjoys yard-work)   She is on Cymbalta and felt she did well until 3 weeks ago. She also takes valium prn.     She now has more tearfulness.    She sleeps ok.   Cognition is fine.   She has fatigue, helped by B12 and amantadine.     She has some mild reduced mood with her mom's death last year.   She has some anxiety but this is not a severe issue.     She has not taken any valium recently.   She feels  cognition is doing well.    Update 10/05/2017: Her MS is mostly stable.   She is on Tysabri and has not had any exacerbation.  Her next infusion is the 26th of December. She has  weakness, spasticity and clumsiness in her left leg and mild weakness in the left arm.   Ampyra has helped her gait some.     She uses a cane.    Bladder function is unchanged with mild leakage at times..   Vision is stable.     Fatigue has done ok (mild but unchanged).    She is usually able to finish all of her tasks.    Amantadine and B12 have her some.   She has had more family stress (son on substances and having other issues, mother lives with her and she watches grandchild a lot).   Mood has ben mostly stable and she takes valium as needed.  Cognition is fine.   From 04/06/2017: MS:   She is currently on Tysabri and has been so for the past 5 or 6 years. She tolerates it well. She has been borderline positive for the JCV antibody on multiple tests. Her  last titer was 0.25 six months ago.    Gait/strength/sensation: Her gait is stable. She has sided leg and arm weakness and spasticity. She has received some benefit from Ampyra.    She gets spasticity especially in the left leg. When the spasms are worse she will take a Valium with benefit. She also rarely takes a Valium if she has more anxiety.  Spasticity is worse when she feels stressed.    Bladder/bowel: She urinary frequency and urgency.   She was once on Vesicare but it did not help much. . She has a colostomy since age 63 because of ulcerative colitis.  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  Vision is worse if tired or stressed.   Mood/cognition: She has mild depression and mild anxiety.    Her mood does better with Cymbalta.  She gets stressed some days and that increases her anxiety.   Her mom is elderly with health issues.   She denies any significant difficulties with cognition.  Fatigue/sleep: She has fatigue, worse when she is hot .   Amantadine has helped the physical fatigue. She has sleep onset insomnia helped by melatonin most nights though about once a week, she still has poor. She sleeps well almost every night now.  MS history:    She presented in 1991 with right optic neuritis. She was treated with IV steroids and improved. A year later, she had left optic neuritis and was again treated with IV steroids. She improved that time as well. Around 1996 she had the onset of numbness in her legs. She was started on Avonex. However, she had trouble tolerating that and one year later switched to Betaseron. She stayed on Betaseron until 2012. In the late 1990's  and the 2000's she had progressive gait disability with left leg weakness and spasticity. I placed her on Tysabri when she transferred to my care in 2012.  She tolerates it well and has had no exacerbations while on it.      REVIEW OF SYSTEMS: Out of a complete 14 system review of symptoms, the  patient complains only of the following symptoms, and all other reviewed systems are negative.  ALLERGIES: Allergies  Allergen Reactions  . Sulfa Antibiotics Hives  . Betadine [Povidone Iodine] Rash  . Povidone-Iodine Rash  HOME MEDICATIONS: Outpatient Medications Prior to Visit  Medication Sig Dispense Refill  . amLODipine (NORVASC) 5 MG tablet TK 1 T PO ONCE A DAY  0  . Biotin 1000 MCG tablet Take 1,000 mcg by mouth 3 (three) times daily.    . cetirizine (ZYRTEC) 10 MG tablet Take 10 mg by mouth daily.    . Cholecalciferol (VITAMIN D3 PO) Take 2,000 Units by mouth daily.    . cyanocobalamin (,VITAMIN B-12,) 1000 MCG/ML injection INJ 1 ML UTD Q 2 WEEKS  5  . dalfampridine (AMPYRA) 10 MG TB12 Take 1 tablet (10 mg total) by mouth 2 (two) times daily. 180 tablet 3  . denosumab (PROLIA) 60 MG/ML SOLN injection Inject 60 mg into the skin every 6 (six) months. Administer in upper arm, thigh, or abdomen    . diazepam (VALIUM) 5 MG tablet Take up to one a day (Patient taking differently: Take 5 mg by mouth as needed. Take up to one a day) 30 tablet 2  . DULoxetine (CYMBALTA) 60 MG capsule TAKE 1 CAPSULE(60 MG) BY MOUTH DAILY 90 capsule 0  . fluticasone (FLONASE) 50 MCG/ACT nasal spray SHAKE LQ AND U 1 SPR IEN QD  2  . multivitamin-iron-minerals-folic acid (CENTRUM) chewable tablet Chew 1 tablet by mouth daily.    . rosuvastatin (CRESTOR) 20 MG tablet Take 20 mg by mouth daily.    . natalizumab (TYSABRI) 300 MG/15ML injection Inject 300 mg into the vein every 30 (thirty) days.    . baclofen (LIORESAL) 10 MG tablet TAKE 1 TABLET BY MOUTH UP TO THREE TIMES DAILY AS NEEDED 270 tablet 0  . LORazepam (ATIVAN) 1 MG tablet Take one to two before the MRi 2 tablet 0  . MELATONIN PO Take 5 mg by mouth at bedtime.     . meloxicam (MOBIC) 7.5 MG tablet Take 1 tablet (7.5 mg total) by mouth daily. 30 tablet 3   No facility-administered medications prior to visit.     PAST MEDICAL HISTORY: Past  Medical History:  Diagnosis Date  . Chronic kidney disease   . Kidney stones   . Multiple sclerosis (HCC)   . Osteoporosis 2018  . Vision abnormalities     PAST SURGICAL HISTORY: Past Surgical History:  Procedure Laterality Date  . CHOLECYSTECTOMY, LAPAROSCOPIC    . LITHOTRIPSY    . WISDOM TOOTH EXTRACTION      FAMILY HISTORY: Family History  Problem Relation Age of Onset  . Breast cancer Mother   . Hypertension Mother   . Diabetes type II Mother   . Kidney disease Mother   . Congestive Heart Failure Mother   . Heart disease Father   . Hypertension Father   . Seizures Father   . Lymphoma Father   . Epilepsy Father     SOCIAL HISTORY: Social History   Socioeconomic History  . Marital status: Married    Spouse name: Not on file  . Number of children: Not on file  . Years of education: Not on file  . Highest education level: Not on file  Occupational History  . Not on file  Social Needs  . Financial resource strain: Not on file  . Food insecurity    Worry: Not on file    Inability: Not on file  . Transportation needs    Medical: Not on file    Non-medical: Not on file  Tobacco Use  . Smoking status: Never Smoker  . Smokeless tobacco: Never Used  Substance and Sexual Activity  .  Alcohol use: No    Alcohol/week: 0.0 standard drinks  . Drug use: No  . Sexual activity: Not on file  Lifestyle  . Physical activity    Days per week: Not on file    Minutes per session: Not on file  . Stress: Not on file  Relationships  . Social Herbalist on phone: Not on file    Gets together: Not on file    Attends religious service: Not on file    Active member of club or organization: Not on file    Attends meetings of clubs or organizations: Not on file    Relationship status: Not on file  . Intimate partner violence    Fear of current or ex partner: Not on file    Emotionally abused: Not on file    Physically abused: Not on file    Forced sexual  activity: Not on file  Other Topics Concern  . Not on file  Social History Narrative  . Not on file      PHYSICAL EXAM  Vitals:   07/25/19 1441  BP: 127/77  Pulse: 83  Temp: 97.8 F (36.6 C)  Weight: 134 lb 12.8 oz (61.1 kg)  Height: 4\' 11"  (1.499 m)   Body mass index is 27.23 kg/m.  Generalized: Well developed, in no acute distress  Cardiology: normal rate and rhythm, no murmur noted Neurological examination  Mentation: Alert oriented to time, place, history taking. Follows all commands speech and language fluent Cranial nerve II-XII: Pupils were equal round reactive to light. Extraocular movements were full, visual field were full on confrontational test. Facial sensation and strength were normal. Uvula tongue midline. Head turning and shoulder shrug  were normal and symmetric. Motor: The motor testing reveals 5 over 5 strength of bilateral upper extremities. 4/5 of right lower and 3/5 of left lower extremity, Good symmetric motor tone is noted throughout.  Sensory: Sensory testing is intact to soft touch on all 4 extremities. No evidence of extinction is noted.  Coordination: Cerebellar testing reveals good finger-nose-finger and heel-to-shin right, not able to lift left leg to perform  Gait and station: Gait is spastic, left limb, walks with cane . Tandem gait not attempted. Romberg is negative. No drift is seen.     DIAGNOSTIC DATA (LABS, IMAGING, TESTING) - I reviewed patient records, labs, notes, testing and imaging myself where available.  MMSE - Mini Mental State Exam 07/25/2019  Orientation to time 4  Orientation to Place 5  Registration 3  Attention/ Calculation 1  Recall 2  Language- name 2 objects 2  Language- repeat 1  Language- follow 3 step command 3  Language- read & follow direction 1  Write a sentence 1  Write a sentence-comments coulndnt draw the clock  Copy design 1  Copy design-comments 11 animals  Total score 24     Lab Results  Component  Value Date   WBC 6.0 10/07/2018   HGB 14.6 10/07/2018   HCT 44.5 10/07/2018   MCV 86 10/07/2018   PLT 272 10/07/2018      Component Value Date/Time   NA 136 10/07/2018 1405   K 4.0 10/07/2018 1405   CL 100 10/07/2018 1405   CO2 23 10/07/2018 1405   GLUCOSE 76 10/07/2018 1405   BUN 13 10/07/2018 1405   CREATININE 0.65 10/07/2018 1405   CALCIUM 9.3 10/07/2018 1405   PROT 7.4 10/07/2018 1405   ALBUMIN 4.9 (H) 10/07/2018 1405   AST 21 10/07/2018  1405   ALT 24 10/07/2018 1405   ALKPHOS 79 10/07/2018 1405   BILITOT 0.7 10/07/2018 1405   GFRNONAA 97 10/07/2018 1405   GFRAA 112 10/07/2018 1405   No results found for: CHOL, HDL, LDLCALC, LDLDIRECT, TRIG, CHOLHDL No results found for: FKCL2X No results found for: VITAMINB12 No results found for: TSH     ASSESSMENT AND PLAN 63 y.o. year old female  has a past medical history of Chronic kidney disease, Kidney stones, Multiple sclerosis (HCC), Osteoporosis (2018), and Vision abnormalities. here with     ICD-10-CM   1. Multiple sclerosis (HCC)  G35 Stratify JCV Ab (w/ Index) w/ Rflx    CMP    CBC with Differential/Platelets  2. High risk medication use  Z79.899 Stratify JCV Ab (w/ Index) w/ Rflx    CMP    CBC with Differential/Platelets  3. Depression, unspecified depression type  F32.9   4. Insomnia, unspecified type  G47.00   5. Vitamin D deficiency  E55.9 Vitamin D, 25-hydroxy    We will continue Tysabri infusions every 5 weeks. I have placed update lab orders today. She will continue Ampyra for gait and Cymbalta for mood. She will consider grief counseling. No new symptoms. Last MRI stable in 03/2018. Will follow up with Dr Epimenio Foot in 6 months. Will get updated MRI at that time. She will follow up sooner for worsening or new symptoms. She verbalizes understanding and agreement with this plan.    Orders Placed This Encounter  Procedures  . Stratify JCV Ab (w/ Index) w/ Rflx  . CMP  . CBC with Differential/Platelets  .  Vitamin D, 25-hydroxy     No orders of the defined types were placed in this encounter.     I spent 40 minutes with the patient. 50% of this time was spent counseling and educating patient on plan of care and medications.    Shawnie Dapper, FNP-C 07/25/2019, 3:06 PM [facility] [address], [address] [place], [place] [address] [phone]

## 2019-07-26 LAB — CBC WITH DIFFERENTIAL/PLATELET
Basophils Absolute: 0.1 10*3/uL (ref 0.0–0.2)
Basos: 1 %
EOS (ABSOLUTE): 0.1 10*3/uL (ref 0.0–0.4)
Eos: 2 %
Hematocrit: 42.6 % (ref 34.0–46.6)
Hemoglobin: 14.1 g/dL (ref 11.1–15.9)
Immature Grans (Abs): 0 10*3/uL (ref 0.0–0.1)
Immature Granulocytes: 1 %
Lymphocytes Absolute: 1.6 10*3/uL (ref 0.7–3.1)
Lymphs: 24 %
MCH: 29.6 pg (ref 26.6–33.0)
MCHC: 33.1 g/dL (ref 31.5–35.7)
MCV: 90 fL (ref 79–97)
Monocytes Absolute: 0.9 10*3/uL (ref 0.1–0.9)
Monocytes: 14 %
NRBC: 1 % — ABNORMAL HIGH (ref 0–0)
Neutrophils Absolute: 3.9 10*3/uL (ref 1.4–7.0)
Neutrophils: 58 %
Platelets: 284 10*3/uL (ref 150–450)
RBC: 4.76 x10E6/uL (ref 3.77–5.28)
RDW: 14.7 % (ref 11.7–15.4)
WBC: 6.6 10*3/uL (ref 3.4–10.8)

## 2019-07-26 LAB — COMPREHENSIVE METABOLIC PANEL
ALT: 26 IU/L (ref 0–32)
AST: 23 IU/L (ref 0–40)
Albumin/Globulin Ratio: 2.3 — ABNORMAL HIGH (ref 1.2–2.2)
Albumin: 4.6 g/dL (ref 3.8–4.8)
Alkaline Phosphatase: 110 IU/L (ref 39–117)
BUN/Creatinine Ratio: 15 (ref 12–28)
BUN: 14 mg/dL (ref 8–27)
Bilirubin Total: 0.9 mg/dL (ref 0.0–1.2)
CO2: 25 mmol/L (ref 20–29)
Calcium: 10.1 mg/dL (ref 8.7–10.3)
Chloride: 101 mmol/L (ref 96–106)
Creatinine, Ser: 0.93 mg/dL (ref 0.57–1.00)
GFR calc Af Amer: 77 mL/min/{1.73_m2} (ref >59)
GFR calc non Af Amer: 67 mL/min/{1.73_m2} (ref >59)
Globulin, Total: 2 g/dL (ref 1.5–4.5)
Glucose: 83 mg/dL (ref 65–99)
Potassium: 3.9 mmol/L (ref 3.5–5.2)
Sodium: 142 mmol/L (ref 134–144)
Total Protein: 6.6 g/dL (ref 6.0–8.5)

## 2019-07-26 LAB — VITAMIN D 25 HYDROXY (VIT D DEFICIENCY, FRACTURES): Vit D, 25-Hydroxy: 42.8 ng/mL (ref 30.0–100.0)

## 2019-07-28 ENCOUNTER — Telehealth: Payer: Self-pay

## 2019-07-28 NOTE — Telephone Encounter (Signed)
-----   Message from Debbora Presto, NP sent at 07/28/2019  8:11 AM EDT ----- Labs stable

## 2019-07-28 NOTE — Telephone Encounter (Signed)
Spoke with the patient and she verbalized understanding her results. No questions or concerns at this time.   

## 2019-08-09 NOTE — Telephone Encounter (Signed)
Yes let us continue the Tysabri every 5 to 6 weeks.

## 2019-08-09 NOTE — Telephone Encounter (Addendum)
Received JCV  Antibody inhibition assay result POSITIVE  Index 0.39.   Placed report in inbox.

## 2019-08-09 NOTE — Telephone Encounter (Signed)
Dr Felecia Shelling, JCV is positive at 0.39. Was 0.44 last visit. Tysabri q5 weeks. Ok to continue?

## 2019-08-10 NOTE — Telephone Encounter (Addendum)
Spoke to pt and relayed JCV results and continue current plan of tysabri IV every 5-6 wks. She verbalized understanding.  Has infusion tomorrow.

## 2019-08-10 NOTE — Telephone Encounter (Signed)
Please let patient know that Dr Felecia Shelling and I have reviewed JCV results. Everything is stable at this point and we will continue Tysabri infusions every 5-6 weeks.

## 2019-08-22 ENCOUNTER — Ambulatory Visit: Payer: Medicare Other | Admitting: Family Medicine

## 2019-09-26 ENCOUNTER — Telehealth: Payer: Self-pay | Admitting: *Deleted

## 2019-09-26 NOTE — Telephone Encounter (Signed)
Received fax back from touch prescribing program that pt re-authorized from 09/26/2019 to 04/27/2020. Pt enrollment # [id number]. Account: [employer]. Site auth # [id number].

## 2019-09-26 NOTE — Telephone Encounter (Signed)
Faxed completed/signed Tysabri pt status report and reauth questionnaire to [facility] at [phone]. Received confirmation.  

## 2019-11-20 ENCOUNTER — Other Ambulatory Visit: Payer: Self-pay | Admitting: Neurology

## 2019-11-22 ENCOUNTER — Telehealth: Payer: Self-pay | Admitting: Family Medicine

## 2019-11-22 NOTE — Telephone Encounter (Signed)
Pt called wanting to discuss with RN the medication Ampyra and also about the pt assistance program. Please advise.

## 2019-11-23 NOTE — Telephone Encounter (Signed)
Pt returned call back and relayed that she is on list for pap, Has called multiple foundations and MS organization to see if can get foundational support.  PAN [employer]N [phone], Assistance Fund [phone].  She will call her pharmacy and see if they have fianancial assistance. I told her I will try my best to call and see what I can find out.  I have a pile of PA 's but will be glad to try and help her. She verbalized understanding.

## 2019-11-23 NOTE — Telephone Encounter (Signed)
I called pt and LMVM for her to return call aout ampyra and PAP.  Ampyra is in generic form.

## 2019-11-29 NOTE — Telephone Encounter (Signed)
LMVM for pt that was f/u on last conversation about PAP for her, is she has had some luck with assistance.  Please return call.

## 2019-11-29 NOTE — Telephone Encounter (Signed)
Patient called back in regards to missed call  

## 2019-11-30 NOTE — Telephone Encounter (Signed)
I called and LMVM for pt relayed that called [facility].  Spoke to Arina,  She stated that th $140.00/mo for generic ampyra, deductible met?   Request for assistance in there pt financial services.  Spoke to Paige in thatt dept.  She stated no copayassistance with generic (only [facility] is offering this (different pharmacy).  No grants open at this time,  Assistance, [employer], [facility], [facility].  She may try them later to see if they open again.  (she has used [facility], [facility] in past). 

## 2020-01-09 ENCOUNTER — Telehealth: Payer: Self-pay | Admitting: Family Medicine

## 2020-01-09 NOTE — Telephone Encounter (Signed)
Will have pcp, refer her to opthamology. (routine eye check).

## 2020-01-09 NOTE — Telephone Encounter (Signed)
Pt called stating that she is needing a referral to the eye doctor. Please advise.

## 2020-01-25 ENCOUNTER — Ambulatory Visit: Payer: Medicare Other | Admitting: Neurology

## 2020-01-25 ENCOUNTER — Encounter: Payer: Self-pay | Admitting: Neurology

## 2020-01-25 ENCOUNTER — Telehealth: Payer: Self-pay | Admitting: *Deleted

## 2020-01-25 ENCOUNTER — Other Ambulatory Visit: Payer: Self-pay

## 2020-01-25 VITALS — BP 122/76 | HR 81 | Temp 98.3°F | Ht 59.0 in | Wt 135.5 lb

## 2020-01-25 DIAGNOSIS — G4700 Insomnia, unspecified: Secondary | ICD-10-CM

## 2020-01-25 DIAGNOSIS — R269 Unspecified abnormalities of gait and mobility: Secondary | ICD-10-CM | POA: Diagnosis not present

## 2020-01-25 DIAGNOSIS — Z79899 Other long term (current) drug therapy: Secondary | ICD-10-CM | POA: Diagnosis not present

## 2020-01-25 DIAGNOSIS — R350 Frequency of micturition: Secondary | ICD-10-CM

## 2020-01-25 DIAGNOSIS — G35 Multiple sclerosis: Secondary | ICD-10-CM | POA: Diagnosis not present

## 2020-01-25 DIAGNOSIS — R261 Paralytic gait: Secondary | ICD-10-CM

## 2020-01-25 NOTE — Telephone Encounter (Signed)
Placed JCV lab in [facility] lock box for routine lab pick up. Results pending. 
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
## 2020-01-26 LAB — CBC WITH DIFFERENTIAL/PLATELET
Basophils Absolute: 0.1 10*3/uL (ref 0.0–0.2)
Basos: 1 %
EOS (ABSOLUTE): 0.4 10*3/uL (ref 0.0–0.4)
Eos: 6 %
Hematocrit: 40.9 % (ref 34.0–46.6)
Hemoglobin: 14 g/dL (ref 11.1–15.9)
Immature Grans (Abs): 0 10*3/uL (ref 0.0–0.1)
Immature Granulocytes: 0 %
Lymphocytes Absolute: 2.1 10*3/uL (ref 0.7–3.1)
Lymphs: 31 %
MCH: 29.9 pg (ref 26.6–33.0)
MCHC: 34.2 g/dL (ref 31.5–35.7)
MCV: 87 fL (ref 79–97)
Monocytes Absolute: 1 10*3/uL — ABNORMAL HIGH (ref 0.1–0.9)
Monocytes: 15 %
Neutrophils Absolute: 3.1 10*3/uL (ref 1.4–7.0)
Neutrophils: 47 %
Platelets: 255 10*3/uL (ref 150–450)
RBC: 4.69 x10E6/uL (ref 3.77–5.28)
RDW: 13.6 % (ref 11.7–15.4)
WBC: 6.6 10*3/uL (ref 3.4–10.8)

## 2020-02-07 NOTE — Telephone Encounter (Signed)
JCV ab drawn on 01/25/2020 indeterminate, index: 0.39. Inhibition assay: positive

## 2020-03-12 ENCOUNTER — Telehealth: Payer: Self-pay | Admitting: Neurology

## 2020-03-12 DIAGNOSIS — G35 Multiple sclerosis: Secondary | ICD-10-CM

## 2020-03-12 MED ORDER — ALPRAZOLAM 0.5 MG PO TABS: ORAL_TABLET | ORAL | 0 refills | Status: DC

## 2020-03-12 NOTE — Telephone Encounter (Signed)
I'd like to check an MRI of the brain w/wo to see if she is having any breakthrough MS activity

## 2020-03-12 NOTE — Telephone Encounter (Signed)
Called pt back. Relayed Dr. Bonnita Hollow recommendation. She is agreeable to this. Requesting to go to [facility] for open MRI d/t her being claustrophobic. I placed order. She will also need xanax to take prior.  I will send request to MD to e-scribe.

## 2020-03-12 NOTE — Telephone Encounter (Signed)
Pt has called to relay:she has an eye dr appointment on Friday due to right eye trouble, not sure if MS related.  Pt stated she wanted Dr Epimenio Foot to be aware.

## 2020-03-12 NOTE — Addendum Note (Signed)
Addended by: Asa Lente on: 03/12/2020 05:55 PM   Modules accepted: Orders

## 2020-03-12 NOTE — Telephone Encounter (Signed)
I called pt back to further discuss. She is having issues with right eye, having some blurry vision. Started a couple months ago but has worsened since. She states she has a "little wave" in her right eye when she looks straight. She has appt with [facility] in [place], [place] this Friday to be further evaluated by opthalmologist. Her next Tysabri is tomorrow at 11:30am here at [facility]. Recent JCV ab drawn on 01/25/2020 indeterminate, index: 0.39. Inhibition assay: positive. Advised I will send to MD and will f/u with recommendation afterwards. She verbalized understanding.

## 2020-03-12 NOTE — Addendum Note (Signed)
Addended by: Arther Abbott on: 03/12/2020 05:50 PM   Modules accepted: Orders

## 2020-03-13 ENCOUNTER — Telehealth: Payer: Self-pay | Admitting: Neurology

## 2020-03-13 NOTE — Telephone Encounter (Signed)
[employer] medicare no auth order faxed to [facility] they will reach out to the patient to schedule. °

## 2020-03-13 NOTE — Telephone Encounter (Signed)
Patient would like to go to GI order sent to GI. They will reach out to the patient to schedule.

## 2020-03-14 ENCOUNTER — Telehealth: Payer: Self-pay | Admitting: *Deleted

## 2020-03-14 NOTE — Telephone Encounter (Signed)
We can send in meloxicam 7.5 mg daily for the leg pain

## 2020-03-14 NOTE — Telephone Encounter (Signed)
Called and spoke with pt. She does not want to try meloxicam. She will continue taking tylenol. She has MRI brain scheduled for 03/21/20 and aware we will call her once results are available.

## 2020-03-14 NOTE — Telephone Encounter (Signed)
Pt called and spoke with Ladona Ridgel in the infusion suite and reported that she forgot to mention to Dr. Epimenio Foot that her legs are also achy. Wanting to know if he wants to adjust MRI orders given this. Currently, she has MRI brain set up for 03/21/20

## 2020-03-16 ENCOUNTER — Other Ambulatory Visit: Payer: Medicare Other

## 2020-03-21 ENCOUNTER — Ambulatory Visit
Admission: RE | Admit: 2020-03-21 | Discharge: 2020-03-21 | Disposition: A | Discharge status: Home / Self Care | Payer: Medicare Other | Source: Ambulatory Visit | Attending: Neurology | Admitting: Neurology

## 2020-03-21 ENCOUNTER — Other Ambulatory Visit: Payer: Self-pay

## 2020-03-21 DIAGNOSIS — G35 Multiple sclerosis: Secondary | ICD-10-CM

## 2020-03-21 MED ORDER — GADOBENATE DIMEGLUMINE 529 MG/ML IV SOLN
12.0000 mL | Freq: Once | INTRAVENOUS | Status: AC | PRN
  Administered 2020-03-21: 12 mL via INTRAVENOUS

## 2020-03-26 ENCOUNTER — Telehealth: Payer: Self-pay | Admitting: Neurology

## 2020-03-26 NOTE — Telephone Encounter (Signed)
Received report from ophthalmology, gave to MD to review once he returns to office.

## 2020-03-26 NOTE — Telephone Encounter (Signed)
Called pt back. Relayed MRI results per Dr. Terrace Arabia note. Pt aware MD out until Wed.  She is wanting to know if Dr. Epimenio Foot received note from ophthalmologist to review. I reviewed and we do not have this. She will have them re-fax to [place] at [phone]. Nothing further needed.

## 2020-03-26 NOTE — Telephone Encounter (Signed)
IMPRESSION:   MRI brain (with and without) demonstrating: - Stable, scattered periventricular and subcortical and pontine foci of chronic demyelinating plaques.   - No acute plaques.    Please call patient, MRI of the brain showed stable MS lesions

## 2020-03-28 ENCOUNTER — Telehealth: Payer: Self-pay | Admitting: Neurology

## 2020-03-28 ENCOUNTER — Telehealth: Payer: Self-pay | Admitting: *Deleted

## 2020-03-28 NOTE — Telephone Encounter (Signed)
I have reviewed the MRI of the brain from 03/21/2020 and compared it to the MRI from 2019.  There are no changes.  It shows a relatively low MS plaque burden and no acute findings.  She has seen ophthalmology.  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

## 2020-03-28 NOTE — Telephone Encounter (Signed)
Faxed completed/signed Tysabri pt status report and reauth questionnaire to [facility] at [phone]. Received confirmation.  

## 2020-03-28 NOTE — Telephone Encounter (Signed)
Received fax from touch prescribing program that pt is re-authorized for tysabri from 03/28/2020-10/27/2020. Pt enrollment number: [id number]. Account:[facility]. Site auth number: [id number].

## 2020-04-04 ENCOUNTER — Inpatient Hospital Stay: Admission: RE | Admit: 2020-04-04 | Payer: Medicare Other | Source: Ambulatory Visit

## 2020-06-04 ENCOUNTER — Telehealth: Payer: Self-pay | Admitting: Neurology

## 2020-06-04 NOTE — Telephone Encounter (Signed)
Pt stated need [employer] to know why rescheduling and behind with Infusions due to stomach issues. Have scheduled GI appt for September 1.

## 2020-06-04 NOTE — Telephone Encounter (Addendum)
Called pt back to further discuss. She has been having stomach issues for the past 5 weeks or so. Ate a bloomin onion yesterday and feels this flared up her sx again. Has had to cx Tysabri infusion the last several times. She is scheduled to come in this Wednesday (06/06/20) for her infusion. She will call Liane, RN tomorrow to let her know for sure if she is coming or not. She is on the cx list for sooner appt for GI doctor. Wants to stay local and not come to [facility]. She will call if she needs anything further. I sent message to Liane, RN to let her know pt will be calling her tomorrow.

## 2020-06-14 ENCOUNTER — Telehealth: Payer: Self-pay | Admitting: Neurology

## 2020-06-14 NOTE — Telephone Encounter (Signed)
Pt is asking for a call re: what Dr Epimenio Foot thinks about her taking the Covid-19 vaccine.  Please call.

## 2020-06-14 NOTE — Telephone Encounter (Addendum)
Spoke with Dr. Epimenio Foot. He approved pt to get covid-19 vaccine. She is on Tysabri for MS.  I called pt back and relayed this to pt. She verbalized understanding. She has rescheduled Tysabri infusion for 06/19/20 at 11:30am. She was supposed to come this week for infusion but not able to d/t stomach issues. She is not planning on getting covid-19 vaccine right now but wanted to make sure Dr. Epimenio Foot approved if she decides to get it.

## 2020-06-20 ENCOUNTER — Other Ambulatory Visit: Payer: Self-pay | Admitting: *Deleted

## 2020-06-20 DIAGNOSIS — Z95828 Presence of other vascular implants and grafts: Secondary | ICD-10-CM

## 2020-06-20 DIAGNOSIS — G35 Multiple sclerosis: Secondary | ICD-10-CM

## 2020-06-20 DIAGNOSIS — Z79899 Other long term (current) drug therapy: Secondary | ICD-10-CM

## 2020-07-11 ENCOUNTER — Encounter: Payer: Self-pay | Admitting: *Deleted

## 2020-07-11 NOTE — Progress Notes (Addendum)
Received report back from [facility] that port a cath interrogated on 07/10/20.  Impression: "Patent right subclavian single-lumen power port catheter. Small amount of fibrin sheath at the tip. Tip position in the mid SVC". Dr. Epimenio Foot reviewed, gave ok for pt to proceed with infusions. Gave copy of report to Bertram, Charity fundraiser in infusion suite. She spoke with pt yesterday and was informed of this result as well.

## 2020-07-30 ENCOUNTER — Ambulatory Visit: Payer: Medicare Other | Admitting: Neurology

## 2020-08-30 ENCOUNTER — Telehealth: Payer: Self-pay | Admitting: *Deleted

## 2020-08-30 ENCOUNTER — Other Ambulatory Visit: Payer: Self-pay | Admitting: *Deleted

## 2020-08-30 DIAGNOSIS — G35 Multiple sclerosis: Secondary | ICD-10-CM

## 2020-08-30 DIAGNOSIS — Z79899 Other long term (current) drug therapy: Secondary | ICD-10-CM

## 2020-08-30 NOTE — Addendum Note (Signed)
Addended by: Tamera Stands D on: 08/30/2020 12:10 PM   Modules accepted: Orders

## 2020-08-30 NOTE — Telephone Encounter (Signed)
Placed JCV lab in [facility] lock box for routine lab pick up. Results pending. 

## 2020-08-31 LAB — CBC WITH DIFFERENTIAL/PLATELET
Basophils Absolute: 0.1 10*3/uL (ref 0.0–0.2)
Basos: 1 %
EOS (ABSOLUTE): 0.3 10*3/uL (ref 0.0–0.4)
Eos: 5 %
Hematocrit: 36.8 % (ref 34.0–46.6)
Hemoglobin: 12.4 g/dL (ref 11.1–15.9)
Immature Grans (Abs): 0 10*3/uL (ref 0.0–0.1)
Immature Granulocytes: 1 %
Lymphocytes Absolute: 2 10*3/uL (ref 0.7–3.1)
Lymphs: 30 %
MCH: 30 pg (ref 26.6–33.0)
MCHC: 33.7 g/dL (ref 31.5–35.7)
MCV: 89 fL (ref 79–97)
Monocytes Absolute: 0.8 10*3/uL (ref 0.1–0.9)
Monocytes: 12 %
Neutrophils Absolute: 3.4 10*3/uL (ref 1.4–7.0)
Neutrophils: 51 %
Platelets: 240 10*3/uL (ref 150–450)
RBC: 4.13 x10E6/uL (ref 3.77–5.28)
RDW: 13.3 % (ref 11.7–15.4)
WBC: 6.6 10*3/uL (ref 3.4–10.8)

## 2020-09-05 LAB — STRATIFY JCV AB (W/ INDEX) W/ RFLX
Index Value: 0.28 — ABNORMAL HIGH
Stratify JCV (TM) Ab w/Reflex Inhibition: UNDETERMINED — AB

## 2020-09-05 LAB — RFLX STRATIFY JCV (TM) AB INHIBITION: JCV Antibody by Inhibition: POSITIVE

## 2020-09-06 ENCOUNTER — Telehealth: Payer: Self-pay | Admitting: *Deleted

## 2020-09-06 NOTE — Telephone Encounter (Signed)
-----   Message from Levert Feinstein, MD sent at 09/03/2020  8:12 AM EST ----- Please call patient for normal laboratory result

## 2020-09-06 NOTE — Telephone Encounter (Signed)
Called and LVM for pt about results per Dr. Terrace Arabia note. Advised pt she did not need to call back unless she had further questions.

## 2020-09-11 ENCOUNTER — Other Ambulatory Visit: Payer: Self-pay

## 2020-09-11 ENCOUNTER — Encounter: Payer: Self-pay | Admitting: Family Medicine

## 2020-09-11 ENCOUNTER — Telehealth: Payer: Self-pay | Admitting: Allergy

## 2020-09-11 ENCOUNTER — Ambulatory Visit (INDEPENDENT_AMBULATORY_CARE_PROVIDER_SITE_OTHER): Payer: Medicare Other | Admitting: Family Medicine

## 2020-09-11 VITALS — BP 112/74 | HR 84 | Resp 20

## 2020-09-11 DIAGNOSIS — J309 Allergic rhinitis, unspecified: Secondary | ICD-10-CM

## 2020-09-11 DIAGNOSIS — T63421D Toxic effect of venom of ants, accidental (unintentional), subsequent encounter: Secondary | ICD-10-CM

## 2020-09-11 NOTE — Telephone Encounter (Signed)
Cindy Frederick called in and states for over a week now she has been having head congestion, ear pain, and sinus pressure.  Cindy Frederick denies fever and sore throat.  Cindy Frederick states it is getting worse as of yesterday.  Cindy Frederick would like to know if there is anything she can take.  Cindy Frederick does want it noted that she has multiple sclerosis and has to be very careful of the medications she can take.  Cindy Frederick is not opposed to doing a tele-visit if needed.  Please advise.

## 2020-09-11 NOTE — Patient Instructions (Signed)
Allergic rhinitis Begin nasal saline rinses several times a day. For best result lean very far forward while administering. Begin Flonase 2 sprays in each nostril once a day for a stuffy nose.  In the right nostril, point the applicator out toward the right ear. In the left nostril, point the applicator out toward the left ear Begin Mucinex [phone] mg twice a day in order to thin out thick post nasal drainage Begin nasal saline gel as needed for dry nostrils.  Fire Wellsite geologist allergy Continue fire ant avoidance measures.  In case of an allergic reaction, take Benadryl 50 mg every 4 hours, and if life-threatening symptoms occur, inject with EpiPen 0.3 mg.  Call the clinic if your symptoms worsen or do not improve  Follow up in 2 months or sooner

## 2020-09-11 NOTE — Progress Notes (Signed)
[address] [place] [address] Dept: [phone]  FOLLOW UP NOTE  Patient ID: KYNADIE YAUN, female    DOB: 09-01-1956  Age: 64 y.o. MRN: [id number] Date of Office Visit: 09/11/2020  Assessment  Chief Complaint: Sinus Problem  HPI OLIMPIA TINCH is a 64 year old female who presents to the clinic for evaluation of post nasal drainage. She reports that she began to experience bilateral ear ache and fullness that began about 1 week ago. Over the last few days, she reports thick post nasal drainage, some epistaxis, and some intermittent sinus pressure. She reports that these symptoms come and go. She denies fever, sweats, chills, sick contacts, nasal drainage, and sneezing. She is occasionally using Flonase that is past the expiration date. She is not taking an antihistamine, using a nasal saline rinse or Mucinex at this time. She reports being bitten by fire ants several months ago and experiencing symptoms including large local swelling and throat closing for which she took Benadryl with relief of symptome. She reports that with her MS she has lost feeling in her leg and was unaware of the ants biting her. She reports there are many ant piles around her home. She does not currently have an EpiPen due to cost. Her current medications are listed in the chart.   Drug Allergies:  Allergies  Allergen Reactions  . Sulfa Antibiotics Hives  . Betadine [Povidone Iodine] Rash  . Povidone-Iodine Rash    Physical Exam: BP 112/74   Pulse 84   Resp 20   SpO2 97%    Physical Exam Vitals reviewed.  Constitutional:      Appearance: Normal appearance.  HENT:     Head: Normocephalic and atraumatic.     Nose:     Comments: Bilateral nares slightly edematous with clear nasal drainage noted. No evidence of bleeding or sores in either nare. Pharynx slightly erythematous with no exudate. Bilateral ears with slightly retracted TM's. Eyes normal. Eyes:     Conjunctiva/sclera: Conjunctivae normal.   Cardiovascular:     Rate and Rhythm: Normal rate and regular rhythm.     Heart sounds: Normal heart sounds. No murmur heard.   Pulmonary:     Effort: Pulmonary effort is normal.     Breath sounds: Normal breath sounds.     Comments: Lungs clear to auscultation Musculoskeletal:        General: Normal range of motion.     Cervical back: Normal range of motion and neck supple.  Skin:    General: Skin is warm and dry.  Neurological:     Mental Status: She is alert and oriented to person, place, and time.  Psychiatric:        Mood and Affect: Mood normal.        Behavior: Behavior normal.        Thought Content: Thought content normal.        Judgment: Judgment normal.     Assessment and Plan: 1. Allergic rhinitis, unspecified seasonality, unspecified trigger   2. Fire ant bite, accidental or unintentional, subsequent encounter     Meds ordered this encounter  Medications  . EPINEPHrine (EPIPEN 2-PAK) 0.3 mg/0.3 mL IJ SOAJ injection    Sig: Inject 0.3 mg into the muscle once for 1 dose.    Dispense:  1 each    Refill:  2    Patient Instructions  Allergic rhinitis Begin nasal saline rinses several times a day. For best result lean very far forward while  administering. Begin Flonase 2 sprays in each nostril once a day for a stuffy nose.  In the right nostril, point the applicator out toward the right ear. In the left nostril, point the applicator out toward the left ear Begin Mucinex [phone] mg twice a day in order to thin out thick post nasal drainage Begin nasal saline gel as needed for dry nostrils.  Fire Wellsite geologist allergy Continue fire ant avoidance measures.  In case of an allergic reaction, take Benadryl 50 mg every 4 hours, and if life-threatening symptoms occur, inject with EpiPen 0.3 mg.  Call the clinic if your symptoms worsen or do not improve  Follow up in 2 months or sooner   Return in about 2 months (around 11/11/2020), or if symptoms worsen or fail to improve.     Thank you for the opportunity to care for this patient.  Please do not hesitate to contact me with questions.  Thermon Leyland, FNP [facility]

## 2020-09-11 NOTE — Telephone Encounter (Signed)
Can you please have this patient come in for an appointment?

## 2020-09-12 ENCOUNTER — Encounter: Payer: Self-pay | Admitting: Family Medicine

## 2020-09-12 MED ORDER — EPINEPHRINE 0.3 MG/0.3ML IJ SOAJ: 0.3000 mg | Freq: Once | INTRAMUSCULAR | 2 refills | Status: AC

## 2020-09-18 ENCOUNTER — Telehealth: Payer: Self-pay | Admitting: Neurology

## 2020-09-18 NOTE — Telephone Encounter (Signed)
Called pt back. She wanted to know if she has had MRI of her back in the past. Advised I only see previous MRI brain MRI's. She is having to go to orthopaedic MD for worsening back sx. Wanting to make sure before going. Reminded her of her upcoming appt on 10/10/20 at 4pm. She asked how many tabs of alprazolam 0.5mg  Dr. Epimenio Foot usually gives her to take for MRI's. I advised 3 tabs. She verbalized understanding, nothing further needed.

## 2020-09-18 NOTE — Telephone Encounter (Signed)
Pt called wanting to discuss precious MRI that she has had. Pt is wanting to know exactly what they show to see if she can avoid getting another MRI done. Please advise.

## 2020-09-20 NOTE — Telephone Encounter (Signed)
JCV ab drawn on 08/30/20 0.28, indeterminate. Inhibition assay: negative.

## 2020-09-24 ENCOUNTER — Telehealth: Payer: Self-pay | Admitting: *Deleted

## 2020-09-24 NOTE — Telephone Encounter (Signed)
Faxed completed/signed Tysabri pt status report and reauth questionnaire to [facility] at [phone]. Received confirmation.  Received fax from touch prescribing program that pt is re-authorized for tysabri from 09/24/2020-04/28/2021.  Pt enrollment number: [id number]. Account:[facility]. Site auth number: [id number].

## 2020-10-10 ENCOUNTER — Telehealth (INDEPENDENT_AMBULATORY_CARE_PROVIDER_SITE_OTHER): Payer: Medicare Other | Admitting: Neurology

## 2020-10-10 ENCOUNTER — Encounter: Payer: Self-pay | Admitting: Neurology

## 2020-10-10 DIAGNOSIS — R261 Paralytic gait: Secondary | ICD-10-CM

## 2020-10-10 DIAGNOSIS — G35 Multiple sclerosis: Secondary | ICD-10-CM | POA: Diagnosis not present

## 2020-10-10 DIAGNOSIS — Z79899 Other long term (current) drug therapy: Secondary | ICD-10-CM | POA: Diagnosis not present

## 2020-10-10 DIAGNOSIS — Z95828 Presence of other vascular implants and grafts: Secondary | ICD-10-CM | POA: Insufficient documentation

## 2020-10-10 DIAGNOSIS — R269 Unspecified abnormalities of gait and mobility: Secondary | ICD-10-CM | POA: Diagnosis not present

## 2020-10-10 DIAGNOSIS — R350 Frequency of micturition: Secondary | ICD-10-CM

## 2020-10-10 MED ORDER — DALFAMPRIDINE ER 10 MG PO TB12: 10.0000 mg | ORAL_TABLET | Freq: Two times a day (BID) | ORAL | 3 refills | Status: DC

## 2020-10-10 NOTE — Progress Notes (Signed)
[facility]  PATIENT: Cindy Frederick DOB: May 25, 1956  REFERRING DOCTOR OR PCP:  Dr. Ardelle Park (PCP),   _________________________________   HISTORICAL  CHIEF COMPLAINT:  No chief complaint on file.   HISTORY OF PRESENT ILLNESS:  Cindy Frederick is a 64 yo woman with relapsing remitting multiple sclerosis.   Update 10/10/2020: Virtual Visit via Video Note I connected with Cindy Frederick on 10/10/20 at  4:00 PM EST by a video enabled telemedicine application.  We had difficulty keeping a connection.  I verified that I am speaking with the correct person.  I discussed the limitations of evaluation and management by telemedicine and the availability of in person appointments. The patient expressed understanding and agreed to proceed.  Patient at home and provider in office  History of Present Illness: She feels her MS is stable on Tysabri.   No exacerbation.  She is getting patient assistance for co-pay help.   JCV Ab was 0.28 negative.  Due to possible Covid 19 exposure today she is quarantining.  She is walking with a limp due to left leg weakness and spasticity.     She uses a cane outside her home but not indoors.    THe left leg is weaker than her left.    Ampyra has helped.  No dysesthesias though muscle spasm are painful when cold.   She takes diazepam which helps spasticity.    She has urinary frequency and nocturia x 4.     She prefers not to take an anticholinergic for her bladder.   Due to Covid-19 she does not get out as much and feels more deconditioned.    She has mild depression that is helped by duloxetine.  She is noting some issues with memory.   Other cognitive domains seem unchanged to her.    She does note that her memory is better with hints.    She has some fatigue.  She sleeps poorly some nights.   She snores some but a PSG 2012 years ago showed no OSA.     She has not done her Covid-19 vaccination.    Observations/Objective:     She is alert  and fully oriented with fluent speech and good attention, knowledge and memory.  Strong voice.      MS history:    She presented in 1991 with right optic neuritis. She was treated with IV steroids and improved. A year later, she had left optic neuritis and was again treated with IV steroids. She improved that time as well. Around 1996 she had the onset of numbness in her legs. She was started on Avonex. However, she had trouble tolerating that and one year later switched to Betaseron. She stayed on Betaseron until 2012. In the late 1990's  and the 2000's she had progressive gait disability with left leg weakness and spasticity. I placed her on Tysabri when she transferred to my care in 2012.  She tolerates it well and has had no exacerbations while on it.     MRI Brain 03/21/2020 showed multiple T2/FLAIR hyperintense foci in the periventricular, juxtacortical and deep white matter of both hemispheres and in the pons in a pattern and configuration consistent with chronic demyelinating plaque associated with multiple sclerosis.  None of the foci appears to be acute.  When compared to the MRI dated 04/17/2018, there is no interval change.    REVIEW OF SYSTEMS: Constitutional: No fevers, chills, sweats, or change in appetite.   She notes fatigue.  Some insomnia Eyes: No visual changes, double vision, eye pain Ear, nose and throat: No hearing loss, ear pain, nasal congestion, sore throat Cardiovascular: No chest pain, palpitations Respiratory: No shortness of breath at rest or with exertion.   No wheezes GastrointestinaI: No nausea, vomiting, diarrhea, abdominal pain, fecal incontinence Genitourinary: Notes frequency but no incontinence.  No nocturia. Musculoskeletal: No neck pain, back pain Integumentary: No rash, pruritus, skin lesions Neurological: as above Psychiatric: Mild depression at this time.  No anxiety Endocrine: No palpitations, diaphoresis, change in appetite, change in weigh or  increased thirst Hematologic/Lymphatic: No anemia, purpura, petechiae. Allergic/Immunologic: No itchy/runny eyes, nasal congestion, recent allergic reactions, rashes  ALLERGIES: Allergies  Allergen Reactions  . Sulfa Antibiotics Hives  . Betadine [Povidone Iodine] Rash  . Povidone-Iodine Rash    HOME MEDICATIONS:  Current Outpatient Medications:  .  amLODipine (NORVASC) 5 MG tablet, TK 1 T PO ONCE A DAY, Disp: , Rfl: 0 .  Biotin 1000 MCG tablet, Take 1,000 mcg by mouth 3 (three) times daily., Disp: , Rfl:  .  cetirizine (ZYRTEC) 10 MG tablet, Take 10 mg by mouth daily., Disp: , Rfl:  .  Cholecalciferol (VITAMIN D3 PO), Take 2,000 Units by mouth daily., Disp: , Rfl:  .  cyanocobalamin (,VITAMIN B-12,) 1000 MCG/ML injection, INJ 1 ML UTD Q 2 WEEKS, Disp: , Rfl: 5 .  dalfampridine 10 MG TB12, Take 1 tablet (10 mg total) by mouth 2 (two) times daily., Disp: 180 tablet, Rfl: 3 .  diazepam (VALIUM) 5 MG tablet, Take up to one a day (Patient taking differently: Take 5 mg by mouth as needed. Take up to one a day), Disp: 30 tablet, Rfl: 2 .  DULoxetine (CYMBALTA) 60 MG capsule, TAKE 1 CAPSULE(60 MG) BY MOUTH DAILY, Disp: 90 capsule, Rfl: 0 .  fluticasone (FLONASE) 50 MCG/ACT nasal spray, SHAKE LQ AND U 1 SPR IEN QD, Disp: , Rfl: 2 .  ibandronate (BONIVA) 150 MG tablet, Take 150 mg by mouth once a week., Disp: , Rfl:  .  multivitamin-iron-minerals-folic acid (CENTRUM) chewable tablet, Chew 1 tablet by mouth daily., Disp: , Rfl:  .  natalizumab (TYSABRI) 300 MG/15ML injection, Inject 300 mg into the vein every 30 (thirty) days., Disp: , Rfl:  .  rosuvastatin (CRESTOR) 20 MG tablet, Take 20 mg by mouth daily., Disp: , Rfl:   PAST MEDICAL HISTORY: Past Medical History:  Diagnosis Date  . Chronic kidney disease   . Kidney stones   . Multiple sclerosis (HCC)   . Osteoporosis 2018  . Vision abnormalities     PAST SURGICAL HISTORY: Past Surgical History:  Procedure Laterality Date  .  CHOLECYSTECTOMY, LAPAROSCOPIC    . LITHOTRIPSY    . WISDOM TOOTH EXTRACTION      FAMILY HISTORY: Family History  Problem Relation Age of Onset  . Breast cancer Mother   . Hypertension Mother   . Diabetes type II Mother   . Kidney disease Mother   . Congestive Heart Failure Mother   . Heart disease Father   . Hypertension Father   . Seizures Father   . Lymphoma Father   . Epilepsy Father           ASSESSMENT AND PLAN  Multiple sclerosis (HCC)  Gait disorder  High risk medication use  Port-A-Cath in place  Spastic gait  Urinary frequency   1.   Continue Tysabri 300 mg every 5 weeks.  She has been low positive JCV antibody status though last test was  negative.    2.   Stay active and exercise as tolerated 3.   Continue Ampyra 10 mg po bid.    4.   Continue Cymbalta for mood.    5.   She will return to see me in about 6 months.   Follow Up Instructions: I discussed the assessment and treatment plan with the patient. The patient was provided an opportunity to ask questions and all were answered. The patient agreed with the plan and demonstrated an understanding of the instructions.    The patient was advised to call back or seek an in-person evaluation if the symptoms worsen or if the condition fails to improve as anticipated.  I provided 20 minutes of non-face-to-face time during this encounter.     Tereasa Yilmaz A. Epimenio Foot, MD, PhD 10/10/2020, 4:33 PM Certified in Neurology, Clinical Neurophysiology, Sleep Medicine, Pain Medicine and Neuroimaging  [facility] [address] [place], [place] [address] [phone]

## 2020-11-22 ENCOUNTER — Other Ambulatory Visit: Payer: Self-pay | Admitting: *Deleted

## 2020-11-22 MED ORDER — DALFAMPRIDINE ER 10 MG PO TB12: 10.0000 mg | ORAL_TABLET | Freq: Two times a day (BID) | ORAL | 3 refills | Status: DC

## 2021-01-01 ENCOUNTER — Telehealth: Payer: Self-pay | Admitting: Neurology

## 2021-01-01 NOTE — Telephone Encounter (Signed)
Called back. Advised Dr. Epimenio Foot is okay with the increased dose. She verbalized understanding.

## 2021-01-01 NOTE — Telephone Encounter (Signed)
[facility]) called, Dr. Ardelle Park (PCP) want to increase DULoxetine (CYMBALTA) 60 MG capsule to 120 mg.  Pt want to make sure Dr. Epimenio Foot is ok with the increase. Would like a call from the nurse.

## 2021-01-03 ENCOUNTER — Telehealth: Payer: Self-pay | Admitting: Neurology

## 2021-01-03 NOTE — Telephone Encounter (Signed)
Pt called, my PCP has prescribed Levothyooxine for my thyroid. Can I take this medication along with my MS medications? Would like a call from the nurse.

## 2021-01-03 NOTE — Telephone Encounter (Signed)
Called pt back. Advised it is fine for her to go on levothyroxine. She verbalized understanding.

## 2021-03-04 ENCOUNTER — Other Ambulatory Visit: Payer: Self-pay | Admitting: *Deleted

## 2021-03-04 ENCOUNTER — Telehealth: Payer: Self-pay | Admitting: *Deleted

## 2021-03-04 DIAGNOSIS — Z79899 Other long term (current) drug therapy: Secondary | ICD-10-CM

## 2021-03-04 DIAGNOSIS — G35 Multiple sclerosis: Secondary | ICD-10-CM

## 2021-03-04 NOTE — Telephone Encounter (Signed)
Placed JCV lab in [facility] lock box for routine lab pick up. Results pending. 

## 2021-03-04 NOTE — Addendum Note (Signed)
Addended by: Tamera Stands D on: 03/04/2021 01:28 PM   Modules accepted: Orders

## 2021-03-05 LAB — CBC WITH DIFFERENTIAL/PLATELET
Basophils Absolute: 0.1 10*3/uL (ref 0.0–0.2)
Basos: 1 %
EOS (ABSOLUTE): 0.3 10*3/uL (ref 0.0–0.4)
Eos: 5 %
Hematocrit: 39.9 % (ref 34.0–46.6)
Hemoglobin: 12.9 g/dL (ref 11.1–15.9)
Immature Grans (Abs): 0 10*3/uL (ref 0.0–0.1)
Immature Granulocytes: 0 %
Lymphocytes Absolute: 1.3 10*3/uL (ref 0.7–3.1)
Lymphs: 26 %
MCH: 27.6 pg (ref 26.6–33.0)
MCHC: 32.3 g/dL (ref 31.5–35.7)
MCV: 85 fL (ref 79–97)
Monocytes Absolute: 0.6 10*3/uL (ref 0.1–0.9)
Monocytes: 13 %
Neutrophils Absolute: 2.8 10*3/uL (ref 1.4–7.0)
Neutrophils: 55 %
Platelets: 255 10*3/uL (ref 150–450)
RBC: 4.68 x10E6/uL (ref 3.77–5.28)
RDW: 14.2 % (ref 11.7–15.4)
WBC: 5.1 10*3/uL (ref 3.4–10.8)

## 2021-03-12 NOTE — Telephone Encounter (Signed)
JCV ab drawn on 03/04/21 positive, index: 1.20. Gave to MD to review.

## 2021-03-28 ENCOUNTER — Telehealth: Payer: Self-pay | Admitting: Neurology

## 2021-03-28 NOTE — Telephone Encounter (Signed)
Called and got voicemail.  I left a message.

## 2021-03-29 NOTE — Telephone Encounter (Signed)
I spoke to Cindy Frederick about the JCV antibody result.  It is middle positive with a titer of 1.2.  Her JCV test 6 months earlier had been negative (0.28 indeterminate with negative inhibition assay) though her test about a year ago was positive (0.39 indeterminate with positive inhibition assay.)  Her MS has done very well on Tysabri.  I will change her interval of dosing to every 6 weeks.  We will recheck the JCV antibody again in about 8 weeks.  If it is any higher we would need to consider changing to a different DMT

## 2021-04-01 ENCOUNTER — Telehealth: Payer: Self-pay | Admitting: *Deleted

## 2021-04-01 NOTE — Telephone Encounter (Signed)
Faxed completed/signed Tysabri pt status report and reauth questionnaire to [facility] at [phone]. Received confirmation.   Received fax from touch prescribing program that pt is re-authorized for tysabri from 04/01/21-10/28/21  Pt enrollment number: [id number]. Account:[facility]. Site auth number: [id number].

## 2021-04-01 NOTE — Telephone Encounter (Signed)
Called pt. She has a follow up appt scheduled for 04/11/21 to see Dr. Epimenio Foot. Dr. Epimenio Foot wanted her to come back in 8 weeks for labs. She is unsure if she needs appt next week. Wondering if this can be pushed out? Advised I will check with MD and call back.

## 2021-04-01 NOTE — Telephone Encounter (Signed)
Called pt back. Advised Dr. Epimenio Foot approved cx appt next week. Ok for r/s 05/20/21 at 1:30pm. She will have appt/labs that day. She verbalized understanding. I informed infusion suite of change.

## 2021-04-11 ENCOUNTER — Ambulatory Visit: Payer: Medicare Other | Admitting: Neurology

## 2021-04-15 ENCOUNTER — Telehealth: Payer: Medicare Other | Admitting: Allergy and Immunology

## 2021-04-15 NOTE — Telephone Encounter (Signed)
Please advise 

## 2021-04-15 NOTE — Telephone Encounter (Signed)
Patient states she believes her allergies have turned into a cold. She's been experiencing a cough, sinus pressure and really bad left ear pain. She wants to know what she can do or if something could be sent in for her.

## 2021-04-16 ENCOUNTER — Ambulatory Visit: Payer: Medicare Other | Admitting: Allergy

## 2021-04-16 ENCOUNTER — Encounter: Payer: Self-pay | Admitting: Allergy

## 2021-04-16 ENCOUNTER — Other Ambulatory Visit: Payer: Self-pay

## 2021-04-16 VITALS — BP 118/72 | HR 90 | Resp 16 | Wt 138.4 lb

## 2021-04-16 DIAGNOSIS — J3089 Other allergic rhinitis: Secondary | ICD-10-CM

## 2021-04-16 DIAGNOSIS — J0100 Acute maxillary sinusitis, unspecified: Secondary | ICD-10-CM

## 2021-04-16 DIAGNOSIS — T63421D Toxic effect of venom of ants, accidental (unintentional), subsequent encounter: Secondary | ICD-10-CM

## 2021-04-16 DIAGNOSIS — H66002 Acute suppurative otitis media without spontaneous rupture of ear drum, left ear: Secondary | ICD-10-CM | POA: Diagnosis not present

## 2021-04-16 MED ORDER — EPINEPHRINE 0.3 MG/0.3ML IJ SOAJ: 0.3000 mg | INTRAMUSCULAR | 1 refills | Status: DC | PRN

## 2021-04-16 MED ORDER — AZITHROMYCIN 500 MG PO TABS: 500.0000 mg | ORAL_TABLET | Freq: Every day | ORAL | 0 refills | Status: AC

## 2021-04-16 NOTE — Telephone Encounter (Signed)
Please inform Cindy Frederick that we can give her azithromycin 500 mg 1 time per day for 3 days as a broad-spectrum antibiotic to treat a secondary bacterial infection that arose from her cold.  Please also let her know that I have not seen her in this clinic in many years and if she is having difficulty in the face of this azithromycin use then she probably needs to come in to see me in clinic.

## 2021-04-16 NOTE — Patient Instructions (Addendum)
Sinusitis with otitis media -left ear appearance consistent with ear infection secondary to sinus infection -take Azithromycin 500mg  x 3 days -can use Mucinex to help loosen mucus drainage -monitor for fever -maintain adequate hydration and rest  Allergic rhinitis -continue nasal saline rinses  -use Nasacort 2 sprays in each nostril once a day for congestion control.  -if needed can use antihistamine like Zyrtec, Xyzal, or Allegra  Fire ant allergy -continue fire ant avoidance measures.  In case of an allergic reaction, take Benadryl 50 mg every 4 hours, and if life-threatening symptoms occur, inject with EpiPen 0.3 mg.  Call the clinic if your symptoms worsen or do not improve  Follow up in 4-6

## 2021-04-16 NOTE — Telephone Encounter (Signed)
Patient in office at the moment. I did inform her of Dr. Kathyrn Lass message but we will see what Dr. Delorse Lek recommends today

## 2021-04-16 NOTE — Progress Notes (Signed)
Follow-up Note  RE: Cindy Frederick MRN: [id number] DOB: 1955/12/15 Date of Office Visit: 04/16/2021   History of present illness: Cindy Frederick is a 65 y.o. female presenting today for sick visit.  She has history of allergic rhinitis and fire ants allergy.  She was last seen in the office on 09/11/2020 by nurse practitioner Ambs.   She has had more outdoor exposure with being at the pool with her grandchild.  She states when she is a sinus infection it comes on quickly.  she states her sinuses in her cheeks started to hurt yesterday. She states her left ear is painful and she has been having frontal headache that feels like a pounding. She states the drainage she is blowing out started out yellow but has become "dark colored".   She has been doing nasal saline rinses.   She will use flonase as needed.  She did use Flonase once today but feels it may be expired.  She started Mucinex yesterday as well.  She would like the quickest course to manage her current symptoms. She otherwise denies taking any antihistamines on a consistent basis. She has not had any fire ant bites.  She states her EpiPen is expired.  Review of systems: Review of Systems  Constitutional: Negative.   HENT:  Positive for congestion, ear pain and sinus pain.   Eyes: Negative.   Respiratory: Negative.    Cardiovascular: Negative.   Gastrointestinal: Negative.   Musculoskeletal: Negative.   Skin: Negative.   Neurological:  Positive for headaches.   All other systems negative unless noted above in HPI  Past medical/social/surgical/family history have been reviewed and are unchanged unless specifically indicated below.  No changes  Medication List: Current Outpatient Medications  Medication Sig Dispense Refill   amLODipine (NORVASC) 5 MG tablet TK 1 T PO ONCE A DAY  0   azithromycin (ZITHROMAX) 500 MG tablet Take 1 tablet (500 mg total) by mouth daily for 3 days. 3 tablet 0   Biotin 1000 MCG tablet Take  1,000 mcg by mouth 3 (three) times daily.     cetirizine (ZYRTEC) 10 MG tablet Take 10 mg by mouth daily.     Cholecalciferol (VITAMIN D3 PO) Take 2,000 Units by mouth daily.     cyanocobalamin (,VITAMIN B-12,) 1000 MCG/ML injection INJ 1 ML UTD Q 2 WEEKS  5   dalfampridine 10 MG TB12 Take 1 tablet (10 mg total) by mouth 2 (two) times daily. 180 tablet 3   diazepam (VALIUM) 5 MG tablet Take up to one a day (Patient taking differently: Take 5 mg by mouth as needed. Take up to one a day) 30 tablet 2   DULoxetine (CYMBALTA) 60 MG capsule TAKE 1 CAPSULE(60 MG) BY MOUTH DAILY (Patient taking differently: Take 120 mg by mouth daily.) 90 capsule 0   EPINEPHrine 0.3 mg/0.3 mL IJ SOAJ injection Inject 0.3 mg into the muscle as needed for anaphylaxis. As needed for life-threatening allergic reactions 2 each 1   fluticasone (FLONASE) 50 MCG/ACT nasal spray SHAKE LQ AND U 1 SPR IEN QD  2   ibandronate (BONIVA) 150 MG tablet Take 150 mg by mouth once a week.     multivitamin-iron-minerals-folic acid (CENTRUM) chewable tablet Chew 1 tablet by mouth daily.     rosuvastatin (CRESTOR) 20 MG tablet Take 20 mg by mouth daily.     No current facility-administered medications for this visit.     Known medication allergies: Allergies  Allergen Reactions  Sulfa Antibiotics Hives   Betadine [Povidone Iodine] Rash   Povidone-Iodine Rash     Physical examination: Blood pressure 118/72, pulse 90, resp. rate 16, weight 138 lb 6.4 oz (62.8 kg), SpO2 98 %.  General: Alert, interactive, in no acute distress. HEENT: PERRLA, left TM is very erythematous and bulging, right TMs pearly gray, turbinates moderately edematous without discharge, post-pharynx non erythematous. Neck: Supple without lymphadenopathy. Lungs: Clear to auscultation without wheezing, rhonchi or rales. {no increased work of breathing. CV: Normal S1, S2 without murmurs. Abdomen: Nondistended, nontender. Skin: Warm and dry, without lesions or  rashes. Extremities:  No clubbing, cyanosis or edema. Neuro:   Grossly intact.  Diagnositics/Labs: None today  Assessment and plan:   Sinusitis with otitis media -left ear appearance consistent with ear infection secondary to sinus infection -take Azithromycin 500mg  x 3 days.  Discussed azithromycin is not first-line therapy for otitis media but is an alternative and would cover both ear and sinus infection. -can use Mucinex to help loosen mucus drainage -monitor for fever -maintain adequate hydration and rest  Allergic rhinitis -continue nasal saline rinses  -use Nasacort 2 sprays in each nostril once a day for congestion control.  -if needed can use antihistamine like Zyrtec, Xyzal, or Allegra  Fire ant allergy -continue fire ant avoidance measures.  In case of an allergic reaction, take Benadryl 50 mg every 4 hours, and if life-threatening symptoms occur, inject with EpiPen 0.3 mg.  Call the clinic if your symptoms worsen or do not improve  Follow up in 4-6 months or sooner  I appreciate the opportunity to take part in Rachna's care. Please do not hesitate to contact me with questions.  Sincerely,   , MD Allergy/Immunology [facility]

## 2021-04-16 NOTE — Telephone Encounter (Signed)
Patient called back today asking if Dr. Lucie Leather has gotten her message. Please advise.

## 2021-04-16 NOTE — Telephone Encounter (Signed)
Patient called back to speak with a nurse but they were all busy at the time. Patient requested to be seen as soon as possible. Patient will be coming in for an OV today (6/28) at 3:00pm.

## 2021-04-17 NOTE — Telephone Encounter (Signed)
Called pt back. She has inner ear infection, currently being treated w/ antibiotics, mucinex-dm, nasal spray w/ cortisone.  She will keep scheduled follow up with Dr. Epimenio Foot on 05/20/21 at 1:30pm.

## 2021-04-17 NOTE — Telephone Encounter (Signed)
Per Freddi Starr, she is r/s for infusion on 04/23/21 at 3pm

## 2021-04-17 NOTE — Telephone Encounter (Signed)
Spoke w/ Liane. She will call pt to get infusion r/s and will let me know update so I can then follow up with pt

## 2021-04-17 NOTE — Telephone Encounter (Signed)
Pt called, rescheduling my infusion appt, will need to reschedule my appt with Dr. Epimenio Foot when my infusion has been scheduled. Would like a call from the nurse.

## 2021-04-23 ENCOUNTER — Telehealth: Payer: Self-pay | Admitting: Neurology

## 2021-04-23 NOTE — Telephone Encounter (Signed)
Pt was discussing with Liane in infusion suite stating that she doesn't think she can continue with getting infusions here. She is wanting to discuss getting her infusions done closer to [place], where she lives. Pt was scheduled to get her infusion today but cancelled. She is due now for her infusion.  Advised we will have to look into this further and what the next steps would be.

## 2021-04-23 NOTE — Telephone Encounter (Addendum)
Per Liane-  Pt has [employer]. She's B&B but has TAF (patient assistance). No active auth on file via insurance. Waiting on response from Crystal P/[employer] on how best to get pt transitioned.

## 2021-04-23 NOTE — Telephone Encounter (Signed)
Received the following message back from Crystal: "It appears the closest site is [facility] in [place].  I have left a message for the site - I need to confirm they are accepting new patients & also confirm they will accept a referral from Dr. Epimenio Foot.  Outside of this site, the other closest site is in [place] (20 miles away) or [place] (26 miles away)."

## 2021-04-24 ENCOUNTER — Encounter: Payer: Self-pay | Admitting: Allergy and Immunology

## 2021-04-24 ENCOUNTER — Ambulatory Visit: Payer: Medicare Other | Admitting: Allergy and Immunology

## 2021-04-24 ENCOUNTER — Other Ambulatory Visit: Payer: Self-pay

## 2021-04-24 VITALS — BP 122/72 | HR 84 | Resp 22

## 2021-04-24 DIAGNOSIS — H66002 Acute suppurative otitis media without spontaneous rupture of ear drum, left ear: Secondary | ICD-10-CM

## 2021-04-24 DIAGNOSIS — H6982 Other specified disorders of Eustachian tube, left ear: Secondary | ICD-10-CM

## 2021-04-24 DIAGNOSIS — J3089 Other allergic rhinitis: Secondary | ICD-10-CM

## 2021-04-24 DIAGNOSIS — T63421D Toxic effect of venom of ants, accidental (unintentional), subsequent encounter: Secondary | ICD-10-CM

## 2021-04-24 NOTE — Patient Instructions (Addendum)
  1.  Can use gentle positive pressure inflation of ear when needed  2.  Treat inflammation with prednisone 10 mg - 1 tablet 1 time per day for 10 days only  3.  Can continue antihistamine and Mucinex and nasal steroid and EpiPen if needed  4.  Further evaluation and treatment???  Yes, if with persistent problems  5.  Return to clinic in 1 year or earlier if problem

## 2021-04-24 NOTE — Telephone Encounter (Signed)
Received update from Crystal:  "I just called [facility] back - they confirmed they can only accept orders from physicians that have privileges at the hospital so they are not able to accept this referral. There is a site in [place] (approximately 20 miles away) but that's not much closer than [place] (approximately 26 miles away)."

## 2021-04-24 NOTE — Telephone Encounter (Signed)
Spoke w/ Veronda Prude w/ intrafusion. She states: "Maydell Knoebel called & scheduled her appt with [place] for Tuesday (04/30/21). She is going to discuss with pt to see how to she wants to proceed with future infusions after that. She will let me know.

## 2021-04-24 NOTE — Progress Notes (Signed)
[place] - [place] - [place] - [place] - [place]   Follow-up Note  Referring Provider: Galvin Proffer, MD Primary Provider: Galvin Proffer, MD Date of Office Visit: 04/24/2021  Subjective:   Cindy Frederick (DOB: May 03, 1956) is a 65 y.o. female who [facility] in re-evaluation of the following:  HPI: Cindy Frederick returns to this clinic in evaluation of allergic rhinoconjunctivitis and fire ant hypersensitivity.  I have not seen her in this clinic in over 5 years.  Her last visit in this clinic was with Dr. Delorse Lek on 16 April 2021 at which point in time she appeared to have a sinus infection with otitis media.  Apparently she developed acute left ear pain on 13 April 2021 for which Dr. Delorse Lek gave her azithromycin and had her start a nasal steroid on 16 November 2020.  She did not really do much better and in fact became somewhat sick to her stomach using azithromycin.  She then went to see her primary care doctor who gave her amoxicillin on 19 April 2021.  She still has lots of fullness in her left ear but the pain has fortunately resolved.  She does not have any tinnitus or vertigo but maybe her hearing is down just a little bit.  She had no associated respiratory tract symptoms.  Allergies as of 04/24/2021       Reactions   Sulfa Antibiotics Hives   Betadine [povidone Iodine] Rash   Povidone-iodine Rash        Medication List    amLODipine 5 MG tablet Commonly known as: NORVASC TK 1 T PO ONCE A DAY   Biotin 1000 MCG tablet Take 1,000 mcg by mouth 3 (three) times daily.   cetirizine 10 MG tablet Commonly known as: ZYRTEC Take 10 mg by mouth daily.   cyanocobalamin 1000 MCG/ML injection Commonly known as: (VITAMIN B-12) INJ 1 ML UTD Q 2 WEEKS   dalfampridine 10 MG Tb12 Take 1 tablet (10 mg total) by mouth 2 (two) times daily.   diazepam 5 MG tablet Commonly known as: Valium Take up to one a day   DULoxetine 60 MG  capsule Commonly known as: CYMBALTA TAKE 1 CAPSULE(60 MG) BY MOUTH DAILY What changed: See the new instructions.   EPINEPHrine 0.3 mg/0.3 mL Soaj injection Commonly known as: EPI-PEN Inject 0.3 mg into the muscle as needed for anaphylaxis. As needed for life-threatening allergic reactions   fluticasone 50 MCG/ACT nasal spray Commonly known as: FLONASE SHAKE LQ AND U 1 SPR IEN QD   ibandronate 150 MG tablet Commonly known as: BONIVA Take 150 mg by mouth once a week.   multivitamin-iron-minerals-folic acid chewable tablet Chew 1 tablet by mouth daily.   rosuvastatin 20 MG tablet Commonly known as: CRESTOR Take 20 mg by mouth daily.   VITAMIN D3 PO Take 2,000 Units by mouth daily.        Past Medical History:  Diagnosis Date   Chronic kidney disease    Kidney stones    Multiple sclerosis (HCC)    Osteoporosis 2018   Vision abnormalities     Past Surgical History:  Procedure Laterality Date   CHOLECYSTECTOMY, LAPAROSCOPIC     LITHOTRIPSY     WISDOM TOOTH EXTRACTION      Review of systems negative except as noted in HPI / PMHx or noted below:  Review of Systems  Constitutional: Negative.   HENT: Negative.    Eyes: Negative.   Respiratory:  Negative.    Cardiovascular: Negative.   Gastrointestinal: Negative.   Genitourinary: Negative.   Musculoskeletal: Negative.   Skin: Negative.   Neurological: Negative.   Endo/Heme/Allergies: Negative.   Psychiatric/Behavioral: Negative.      Objective:   Vitals:   04/24/21 1159  BP: 122/72  Pulse: 84  Resp: (!) 22  SpO2: 98%          Physical Exam Constitutional:      Appearance: She is not diaphoretic.  HENT:     Head: Normocephalic.     Right Ear: Tympanic membrane, ear canal and external ear normal.     Left Ear: Ear canal and external ear normal. Tympanic membrane is retracted (Minimal erythema). Tympanic membrane has normal mobility (Retracted tympanic membrane cleared with positive pressure  inflation of ear).     Nose: Nose normal. No mucosal edema or rhinorrhea.     Mouth/Throat:     Pharynx: Uvula midline. No oropharyngeal exudate.  Eyes:     Conjunctiva/sclera: Conjunctivae normal.  Neck:     Thyroid: No thyromegaly.     Trachea: Trachea normal. No tracheal tenderness or tracheal deviation.  Cardiovascular:     Rate and Rhythm: Normal rate and regular rhythm.     Heart sounds: Normal heart sounds, S1 normal and S2 normal. No murmur heard. Pulmonary:     Effort: No respiratory distress.     Breath sounds: Normal breath sounds. No stridor. No wheezing or rales.  Lymphadenopathy:     Head:     Right side of head: No tonsillar adenopathy.     Left side of head: No tonsillar adenopathy.     Cervical: No cervical adenopathy.  Skin:    Findings: No erythema or rash.     Nails: There is no clubbing.  Neurological:     Mental Status: She is alert.    Diagnostics: none  Assessment and Plan:   1. Dysfunction of left eustachian tube   2. Non-recurrent acute suppurative otitis media of left ear without spontaneous rupture of tympanic membrane   3. Perennial allergic rhinitis   4. Fire ant bite, accidental or unintentional, subsequent encounter     1.  Can use gentle positive pressure inflation of ear when needed  2.  Treat inflammation with prednisone 10 mg - 1 tablet 1 time per day for 10 days only  3.  Can continue antihistamine and Mucinex and nasal steroid and EpiPen if needed  4.  Further evaluation and treatment???  Yes, if with persistent problems  5.  Return to clinic in 1 year or earlier if problem  Cindy Frederick appears to have ETD most likely from her recent episode of otitis media and I am going to have her use some anti-inflammatory agents to help with the inflammation of her eustachian tube including the use of a systemic steroid at low-dose for the next 10 days.  Assuming she does well with this plan we will see her back in this clinic in 1 year or earlier if  there is a problem.  Cindy Schimke, MD Allergy / Immunology [facility]

## 2021-04-25 ENCOUNTER — Encounter: Payer: Self-pay | Admitting: Allergy and Immunology

## 2021-04-30 ENCOUNTER — Other Ambulatory Visit: Payer: Self-pay | Admitting: *Deleted

## 2021-04-30 DIAGNOSIS — Z79899 Other long term (current) drug therapy: Secondary | ICD-10-CM

## 2021-04-30 DIAGNOSIS — G35 Multiple sclerosis: Secondary | ICD-10-CM

## 2021-05-01 ENCOUNTER — Other Ambulatory Visit (INDEPENDENT_AMBULATORY_CARE_PROVIDER_SITE_OTHER): Payer: Self-pay

## 2021-05-01 ENCOUNTER — Telehealth: Payer: Self-pay | Admitting: Neurology

## 2021-05-01 ENCOUNTER — Other Ambulatory Visit: Payer: Self-pay

## 2021-05-01 DIAGNOSIS — Z0289 Encounter for other administrative examinations: Secondary | ICD-10-CM

## 2021-05-01 DIAGNOSIS — Z79899 Other long term (current) drug therapy: Secondary | ICD-10-CM

## 2021-05-01 DIAGNOSIS — G35 Multiple sclerosis: Secondary | ICD-10-CM

## 2021-05-01 NOTE — Telephone Encounter (Signed)
Placed JCV lab in [facility] lock box for routine lab pick up. Results pending. 

## 2021-05-02 LAB — CBC WITH DIFFERENTIAL/PLATELET
Basophils Absolute: 0.1 10*3/uL (ref 0.0–0.2)
Basos: 1 %
EOS (ABSOLUTE): 0.2 10*3/uL (ref 0.0–0.4)
Eos: 3 %
Hematocrit: 40.2 % (ref 34.0–46.6)
Hemoglobin: 13.1 g/dL (ref 11.1–15.9)
Immature Grans (Abs): 0 10*3/uL (ref 0.0–0.1)
Immature Granulocytes: 0 %
Lymphocytes Absolute: 2.4 10*3/uL (ref 0.7–3.1)
Lymphs: 31 %
MCH: 27.8 pg (ref 26.6–33.0)
MCHC: 32.6 g/dL (ref 31.5–35.7)
MCV: 85 fL (ref 79–97)
Monocytes Absolute: 1.1 10*3/uL — ABNORMAL HIGH (ref 0.1–0.9)
Monocytes: 14 %
Neutrophils Absolute: 4.1 10*3/uL (ref 1.4–7.0)
Neutrophils: 51 %
Platelets: 279 10*3/uL (ref 150–450)
RBC: 4.72 x10E6/uL (ref 3.77–5.28)
RDW: 14.4 % (ref 11.7–15.4)
WBC: 7.9 10*3/uL (ref 3.4–10.8)

## 2021-05-06 NOTE — Telephone Encounter (Signed)
Pt JCV AB result has come back positive. The index value is 1.14. I will send the result to Dr Epimenio Foot for review.

## 2021-05-20 ENCOUNTER — Encounter: Payer: Self-pay | Admitting: Neurology

## 2021-05-20 ENCOUNTER — Other Ambulatory Visit: Payer: Self-pay | Admitting: Internal Medicine

## 2021-05-20 ENCOUNTER — Other Ambulatory Visit: Payer: Self-pay

## 2021-05-20 ENCOUNTER — Ambulatory Visit: Payer: Medicare Other | Admitting: Neurology

## 2021-05-20 ENCOUNTER — Telehealth: Payer: Self-pay

## 2021-05-20 VITALS — BP 124/73 | HR 83 | Ht 59.0 in | Wt 139.5 lb

## 2021-05-20 DIAGNOSIS — Z79899 Other long term (current) drug therapy: Secondary | ICD-10-CM | POA: Diagnosis not present

## 2021-05-20 DIAGNOSIS — R261 Paralytic gait: Secondary | ICD-10-CM

## 2021-05-20 DIAGNOSIS — R413 Other amnesia: Secondary | ICD-10-CM

## 2021-05-20 DIAGNOSIS — R350 Frequency of micturition: Secondary | ICD-10-CM

## 2021-05-20 DIAGNOSIS — N6489 Other specified disorders of breast: Secondary | ICD-10-CM

## 2021-05-20 DIAGNOSIS — G35 Multiple sclerosis: Secondary | ICD-10-CM | POA: Diagnosis not present

## 2021-05-20 DIAGNOSIS — Z95828 Presence of other vascular implants and grafts: Secondary | ICD-10-CM | POA: Diagnosis not present

## 2021-05-20 DIAGNOSIS — F32A Depression, unspecified: Secondary | ICD-10-CM
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
## 2021-05-20 NOTE — Telephone Encounter (Signed)
Vumerity Start Form faxed to [employer]. Received a receipt of confirmation.  Initiated PA on CMM. Sent to [facility]. Should have a determination within 1-3 business days.Key: [id number].

## 2021-05-21 NOTE — Telephone Encounter (Signed)
Appeal letter for Vumerity faxed to [employer]. Should have a determination within 3-5 business days.

## 2021-05-21 NOTE — Telephone Encounter (Signed)
PA for Vumerity Rx because patient has not tried and failed dimethyl fumarate.  I will work on the appeal.

## 2021-05-22 ENCOUNTER — Telehealth: Payer: Self-pay | Admitting: Allergy and Immunology

## 2021-05-22 NOTE — Telephone Encounter (Signed)
[facility] and patient has been scheduled to see Aquilla Hacker, PA on Tuesday, August 9th, needing to check in at 10:15 am.  I asked patient to bring her insurance card with her and her photo ID and list of medications. Patient agreed with plan.  Will fax notes to [facility].

## 2021-05-22 NOTE — Telephone Encounter (Signed)
Patient called and said lt ear was giving her a lot of pain last night and she took expire med but did not help . Took 20mg  predisone and it help some she would like to know want to do. [facility] [address] 

## 2021-05-22 NOTE — Telephone Encounter (Signed)
Please have Cindy Frederick visit with ENT as soon as possible for recalcitrant ETD

## 2021-05-22 NOTE — Telephone Encounter (Signed)
Please advise 

## 2021-05-22 NOTE — Telephone Encounter (Signed)
Talked with patient.  Per Dr. Lucie Leather, patient can try Prednisone 10mg  once daily for three days and use positive ear way inflation.  Informed patient.  She does want to go see an ENT, however, [facility] has nothing available until September.  Will try [facility].

## 2021-05-25 LAB — QUANTIFERON-TB GOLD PLUS
QuantiFERON Mitogen Value: >10 IU/mL
QuantiFERON Nil Value: 0.07 IU/mL
QuantiFERON TB1 Ag Value: 0.08 IU/mL
QuantiFERON TB2 Ag Value: 0.08 IU/mL
QuantiFERON-TB Gold Plus: NEGATIVE

## 2021-05-27 NOTE — Telephone Encounter (Signed)
[facility]) called, following on denied PA for Vumerity.  Contact info: [phone]

## 2021-05-27 NOTE — Telephone Encounter (Signed)
Carollee Herter from [employer] called stating they faxed over an appeal, and if we want them to follow through with the appeal. Ref key:  [id number]

## 2021-05-31 ENCOUNTER — Other Ambulatory Visit: Payer: Self-pay | Admitting: Internal Medicine

## 2021-05-31 ENCOUNTER — Ambulatory Visit
Admission: RE | Admit: 2021-05-31 | Discharge: 2021-05-31 | Disposition: A | Discharge status: Home / Self Care | Payer: PRIVATE HEALTH INSURANCE | Source: Ambulatory Visit | Attending: Internal Medicine | Admitting: Internal Medicine

## 2021-05-31 ENCOUNTER — Other Ambulatory Visit: Payer: Self-pay

## 2021-05-31 ENCOUNTER — Ambulatory Visit
Admission: RE | Admit: 2021-05-31 | Discharge: 2021-05-31 | Disposition: A | Discharge status: Home / Self Care | Payer: Medicare Other | Source: Ambulatory Visit | Attending: Internal Medicine | Admitting: Internal Medicine

## 2021-05-31 DIAGNOSIS — N6489 Other specified disorders of breast: Secondary | ICD-10-CM

## 2021-06-03 NOTE — Telephone Encounter (Signed)
I called [facility].  The appeal for Vumerity was approved on May 27, 2021.  The case ID is: [id number].  I have informed Crystal at [employer] of this approval.

## 2021-06-10 NOTE — Telephone Encounter (Signed)
I called patient.  She has received Vumerity but has not started taking it yet.  She plans on starting it tomorrow.  I will check with her next week to make sure she is doing well.

## 2021-06-10 NOTE — Addendum Note (Signed)
Addended by: Geronimo Running A on: 06/10/2021 02:49 PM   Modules accepted: Orders

## 2021-06-11 ENCOUNTER — Telehealth: Payer: Self-pay | Admitting: Neurology

## 2021-06-11 NOTE — Telephone Encounter (Signed)
See telephone note from 06/11/21.

## 2021-06-11 NOTE — Telephone Encounter (Signed)
Pt called wondering what other of her daily medications can she take with her Diroximel Fumarate (VUMERITY) 231 MG CPDR. Pt requesting a call back.

## 2021-06-11 NOTE — Telephone Encounter (Signed)
Called pt back. Advised she can continue her other meds as prescribed. She received Vumerity yesterday from specialty pharmacy and started it today. She will call back if she has any other questions/concerns. I informed KD,RN that she started medication.

## 2021-07-08 ENCOUNTER — Telehealth: Payer: Self-pay | Admitting: Neurology

## 2021-07-08 NOTE — Telephone Encounter (Signed)
Pt called wanting to know if she can have a tattoo since she has MS. Pt requesting a call back.

## 2021-07-08 NOTE — Telephone Encounter (Signed)
I called and spoke with pt. Advised it is ok to get tattoo. Make sure to keep clean/dry to avoid infection. She verbalized understanding.

## 2021-07-10 ENCOUNTER — Telehealth: Payer: Self-pay | Admitting: Neurology

## 2021-07-10 NOTE — Telephone Encounter (Signed)
Called the patient back and advised that no further lab work is needed until her upcoming apt. She was appreciative for the call back. Pt had no questions at this time but was encouraged to call back if questions arise.

## 2021-07-10 NOTE — Telephone Encounter (Signed)
Pt is asking for a call to discuss when she needs to come in for lab work

## 2021-07-16 ENCOUNTER — Telehealth: Payer: Self-pay | Admitting: Neurology

## 2021-07-16 NOTE — Telephone Encounter (Signed)
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  This was followed up by breast lumpectomy 07/01/2021.  The lumpectomy showed "fibrocystic changes, including cystic apocrine metaplasia, florid usual ductal hyperplasia and patchy stromal fibrosis."

## 2021-07-17 ENCOUNTER — Telehealth: Payer: Self-pay | Admitting: Neurology

## 2021-07-17 NOTE — Telephone Encounter (Signed)
noted 

## 2021-07-17 NOTE — Telephone Encounter (Signed)
FYI-Pt called wanting to inform her provider that she had lab work done at another providers office and her Liver ALT range was a bit elevated.

## 2021-07-18 NOTE — Telephone Encounter (Signed)
Called and LVM for pt asking that she have the provider who ordered tests fax a copy of lab results to [place] at [phone] attn: Dr. Epimenio Foot so that he can review labs results. We can then follow up with her after he reviews this.

## 2021-07-18 NOTE — Telephone Encounter (Signed)
Received copy of labs via fax, provided to Dr. Epimenio Foot to review.

## 2021-07-18 NOTE — Telephone Encounter (Signed)
Called pt. Advised Dr. Epimenio Foot reviewed lab result (ALT 44 (0-32)). He is not concerned about this. If it continues to be elevated, then we would need to look further into this. Her other provider is repeating labs in a month and then will recheck q 3 mos. She will forward copy of results to Dr. Epimenio Foot. Nothing further needed.

## 2021-07-18 NOTE — Telephone Encounter (Signed)
Re: the phone note that was sent to Pod 1 on yesterday, pt would like a call from RN to know if this is concerning to Dr Epimenio Foot, please call.

## 2021-08-07 ENCOUNTER — Other Ambulatory Visit: Payer: Self-pay

## 2021-08-07 ENCOUNTER — Telehealth: Payer: Self-pay | Admitting: Neurology

## 2021-08-07 MED ORDER — VUMERITY 231 MG PO CPDR: 462.0000 mg | DELAYED_RELEASE_CAPSULE | Freq: Two times a day (BID) | ORAL | 1 refills | Status: DC

## 2021-08-07 NOTE — Telephone Encounter (Signed)
Rx sent to pharmacy as requested.

## 2021-08-07 NOTE — Telephone Encounter (Signed)
Pt is wanting to know if she can switch her Diroximel Fumarate (VUMERITY) 231 MG CPDR to the [facility] and not use [facility] for that medication anymore. Please advise.

## 2021-08-07 NOTE — Telephone Encounter (Signed)
Returned call and spoke to pt regarding her changing pharmacy for her Vumerity. Pt states [facility] is needing her to call every month for refills and is to much for her to do so. She prefers to go through [facility] who is willing to refill for 90 days and wont have to call each month. I explained to pt that [employer] usually chooses the preferred specialty pharmacy based on her insurance coverage and she could potentially run into an issues. She understands and would like to still switch for now. Informed pt to call us if she has any problem w getting her medication. Pt was appreciative of the call and had no further questions at this time.

## 2021-09-27 ENCOUNTER — Other Ambulatory Visit: Payer: Self-pay | Admitting: Neurology

## 2021-10-16 ENCOUNTER — Other Ambulatory Visit: Payer: Self-pay | Admitting: *Deleted

## 2021-10-16 DIAGNOSIS — G35 Multiple sclerosis: Secondary | ICD-10-CM

## 2021-10-16 DIAGNOSIS — R269 Unspecified abnormalities of gait and mobility: Secondary | ICD-10-CM

## 2021-10-16 MED ORDER — DALFAMPRIDINE ER 10 MG PO TB12
10.0000 mg | ORAL_TABLET | Freq: Two times a day (BID) | ORAL | 3 refills | Status: DC
Start: 2021-10-16 — End: 2022-09-30

## 2021-10-28 ENCOUNTER — Telehealth: Payer: Self-pay | Admitting: Neurology

## 2021-10-28 MED ORDER — VUMERITY 231 MG PO CPDR: DELAYED_RELEASE_CAPSULE | ORAL | 1 refills | Status: DC

## 2021-10-28 NOTE — Telephone Encounter (Signed)
Called and LVM for pt with information.

## 2021-10-28 NOTE — Telephone Encounter (Signed)
Pt called states [facility] called her and they need [place] to go ahead and send in the prescription refill for VUMERITY 231 MG CPDR. Pt requesting a call back once this has been sent.

## 2021-10-28 NOTE — Telephone Encounter (Signed)
Refill has been sent to the pharmacy for the patient. 

## 2021-11-21 ENCOUNTER — Encounter: Payer: Self-pay | Admitting: Neurology

## 2021-11-21 ENCOUNTER — Ambulatory Visit (INDEPENDENT_AMBULATORY_CARE_PROVIDER_SITE_OTHER): Payer: Medicare Other | Admitting: Neurology

## 2021-11-21 VITALS — BP 132/76 | HR 82 | Ht 59.0 in | Wt 140.0 lb

## 2021-11-21 DIAGNOSIS — Z79899 Other long term (current) drug therapy: Secondary | ICD-10-CM

## 2021-11-21 DIAGNOSIS — G35 Multiple sclerosis: Secondary | ICD-10-CM | POA: Diagnosis not present

## 2021-11-21 DIAGNOSIS — R350 Frequency of micturition: Secondary | ICD-10-CM

## 2021-11-21 DIAGNOSIS — R269 Unspecified abnormalities of gait and mobility: Secondary | ICD-10-CM

## 2021-11-21 DIAGNOSIS — R413 Other amnesia: Secondary | ICD-10-CM
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
## 2021-11-22 LAB — CBC WITH DIFFERENTIAL/PLATELET
Basophils Absolute: 0 10*3/uL (ref 0.0–0.2)
Basos: 1 %
EOS (ABSOLUTE): 0.1 10*3/uL (ref 0.0–0.4)
Eos: 2 %
Hematocrit: 43.9 % (ref 34.0–46.6)
Hemoglobin: 14.3 g/dL (ref 11.1–15.9)
Immature Grans (Abs): 0 10*3/uL (ref 0.0–0.1)
Immature Granulocytes: 0 %
Lymphocytes Absolute: 0.7 10*3/uL (ref 0.7–3.1)
Lymphs: 13 %
MCH: 28.1 pg (ref 26.6–33.0)
MCHC: 32.6 g/dL (ref 31.5–35.7)
MCV: 86 fL (ref 79–97)
Monocytes Absolute: 0.6 10*3/uL (ref 0.1–0.9)
Monocytes: 12 %
Neutrophils Absolute: 3.7 10*3/uL (ref 1.4–7.0)
Neutrophils: 72 %
Platelets: 259 10*3/uL (ref 150–450)
RBC: 5.09 x10E6/uL (ref 3.77–5.28)
RDW: 15.7 % — ABNORMAL HIGH (ref 11.7–15.4)
WBC: 5.2 10*3/uL (ref 3.4–10.8)

## 2021-11-22 LAB — COMPREHENSIVE METABOLIC PANEL
ALT: 26 IU/L (ref 0–32)
AST: 26 IU/L (ref 0–40)
Albumin/Globulin Ratio: 2.6 — ABNORMAL HIGH (ref 1.2–2.2)
Albumin: 5.2 g/dL — ABNORMAL HIGH (ref 3.8–4.8)
Alkaline Phosphatase: 89 IU/L (ref 44–121)
BUN/Creatinine Ratio: 20 (ref 12–28)
BUN: 14 mg/dL (ref 8–27)
Bilirubin Total: 0.8 mg/dL (ref 0.0–1.2)
CO2: 29 mmol/L (ref 20–29)
Calcium: 9.5 mg/dL (ref 8.7–10.3)
Chloride: 101 mmol/L (ref 96–106)
Creatinine, Ser: 0.69 mg/dL (ref 0.57–1.00)
Globulin, Total: 2 g/dL (ref 1.5–4.5)
Glucose: 99 mg/dL (ref 70–99)
Potassium: 3.8 mmol/L (ref 3.5–5.2)
Sodium: 142 mmol/L (ref 134–144)
Total Protein: 7.2 g/dL (ref 6.0–8.5)
eGFR: 97 mL/min/{1.73_m2} (ref >59)

## 2021-12-24 ENCOUNTER — Other Ambulatory Visit: Payer: Self-pay

## 2021-12-24 ENCOUNTER — Telehealth: Payer: Self-pay | Admitting: Neurology

## 2021-12-24 MED ORDER — VUMERITY 231 MG PO CPDR: DELAYED_RELEASE_CAPSULE | ORAL | 3 refills | Status: DC

## 2021-12-24 NOTE — Telephone Encounter (Signed)
Reviewed pt chart. Cindy Frederick, CMA already e-scribed to pharmacy today at [phone].  ?

## 2021-12-24 NOTE — Telephone Encounter (Signed)
Pt request refill for Diroximel Fumarate (VUMERITY) 231 MG CPDR at [facility] ?

## 2022-01-27 IMAGING — MG MM BREAST BX W/ LOC DEV 1ST LESION IMAGE BX SPEC STEREO GUIDE*R*
8 of 16 series · 8 of 28 positions shown · non-contrast
Comparison: Previous exams.
COMPARISON: Previous exams.

Addendum:
CLINICAL DATA: Biopsy of a right breast mass

EXAM:
RIGHT BREAST STEREOTACTIC CORE NEEDLE BIOPSY

[R (1 of 8)]
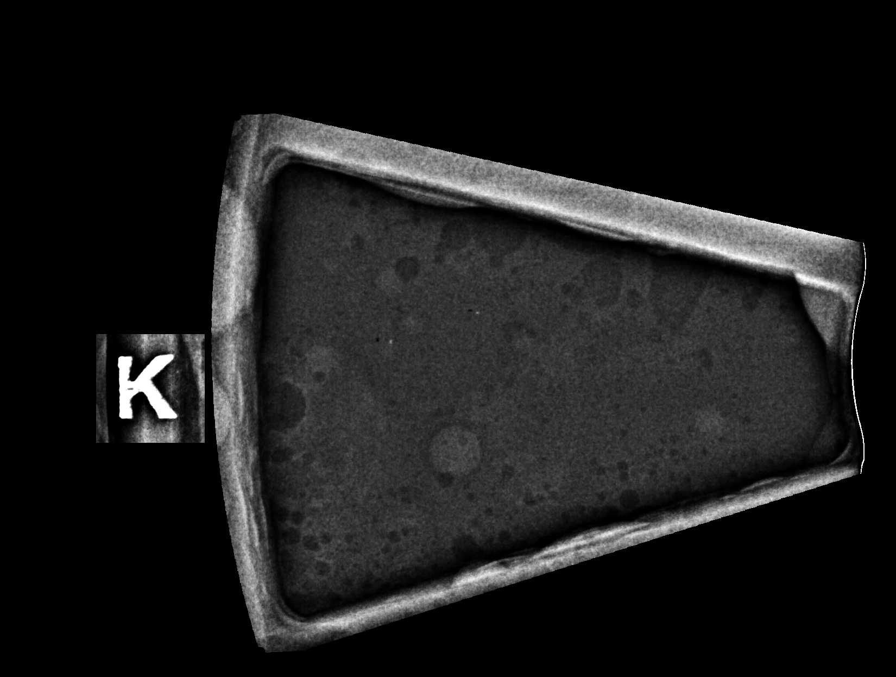

[R (2 of 8)]
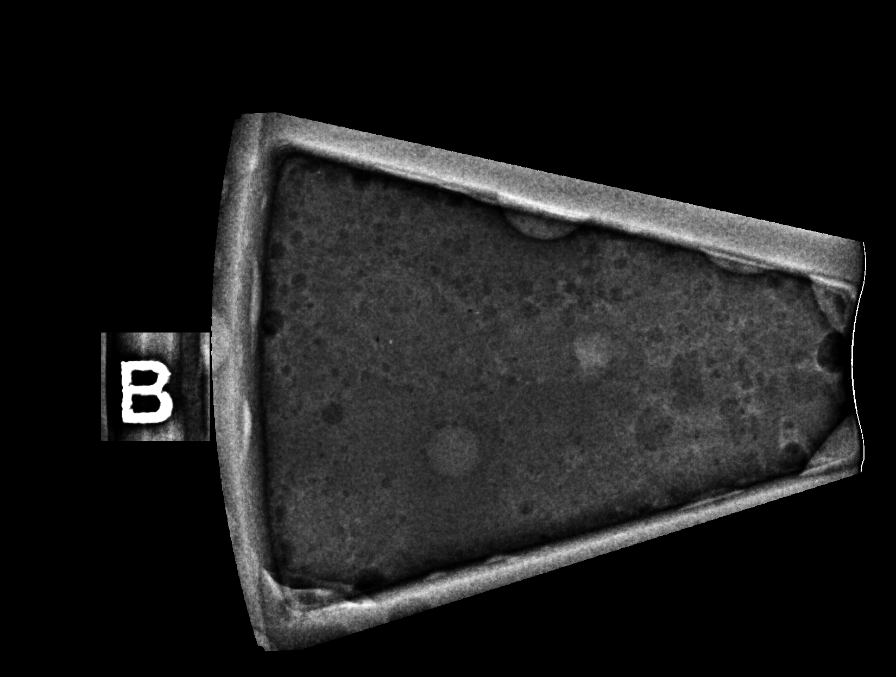

[R (3 of 8)]
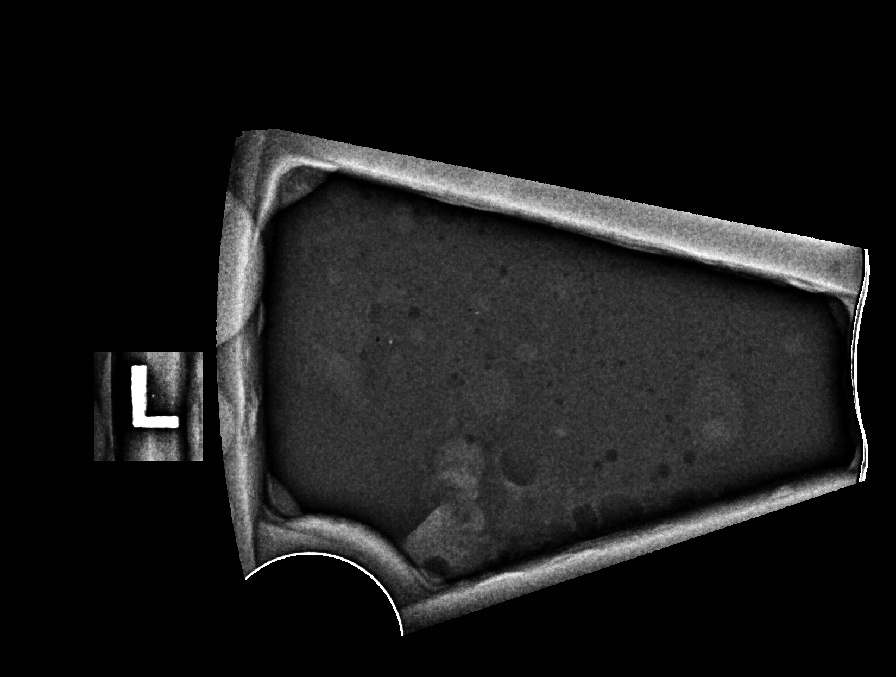

[R (4 of 8)]
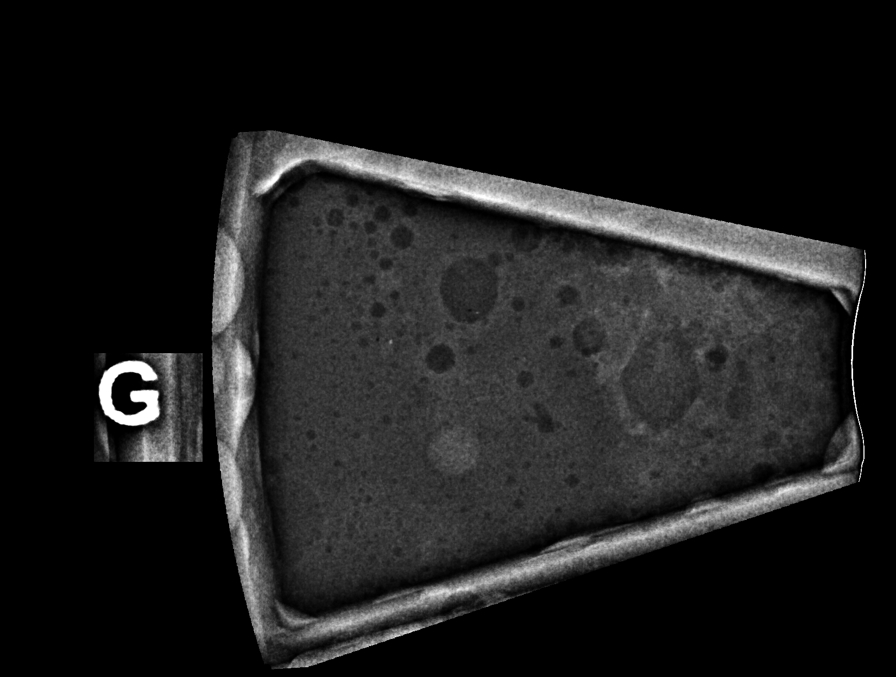

[R (5 of 8)]
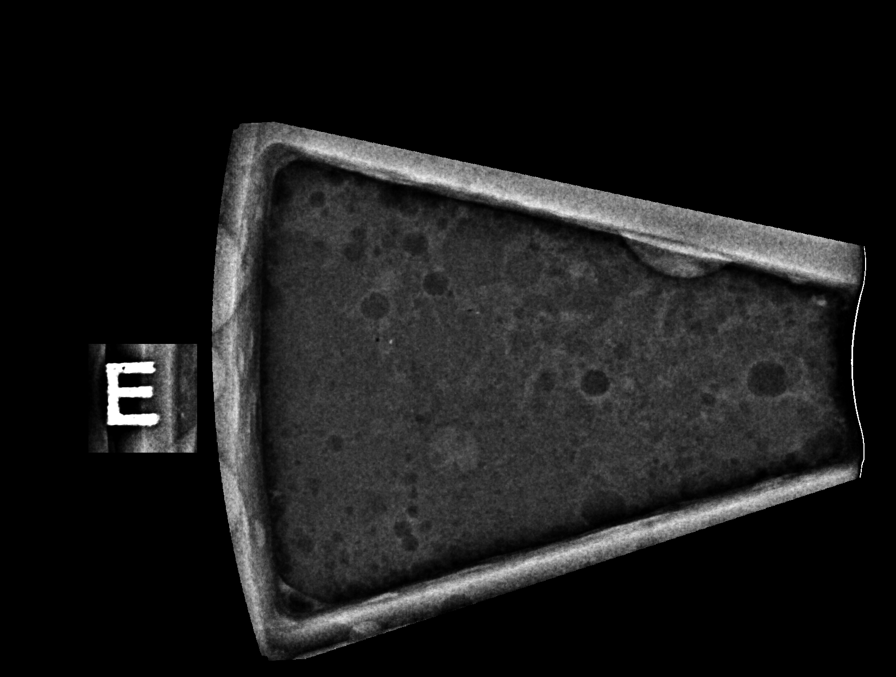

[R (6 of 8)]
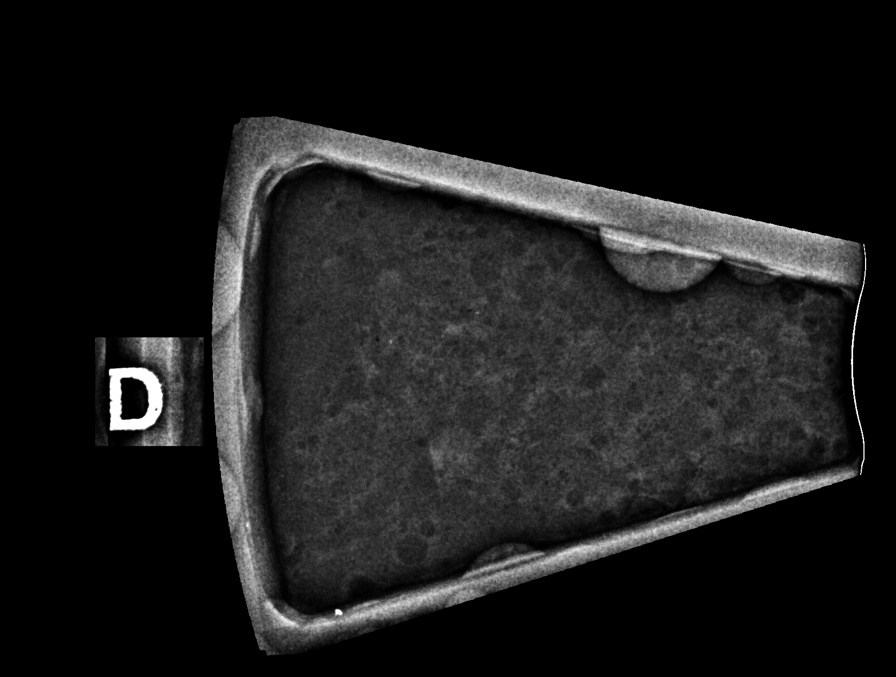

[R (7 of 8)]
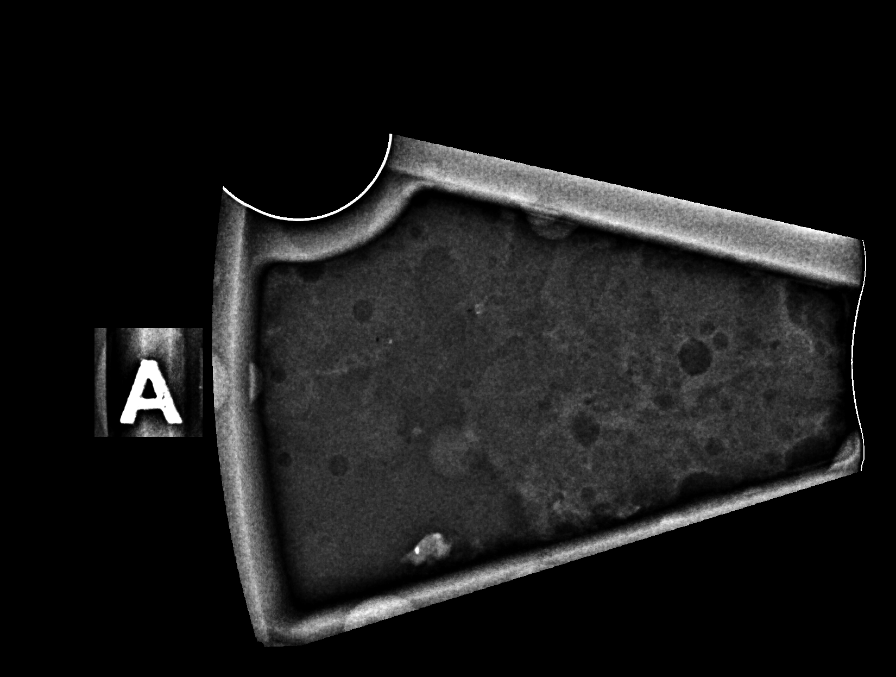

[R (8 of 8)]
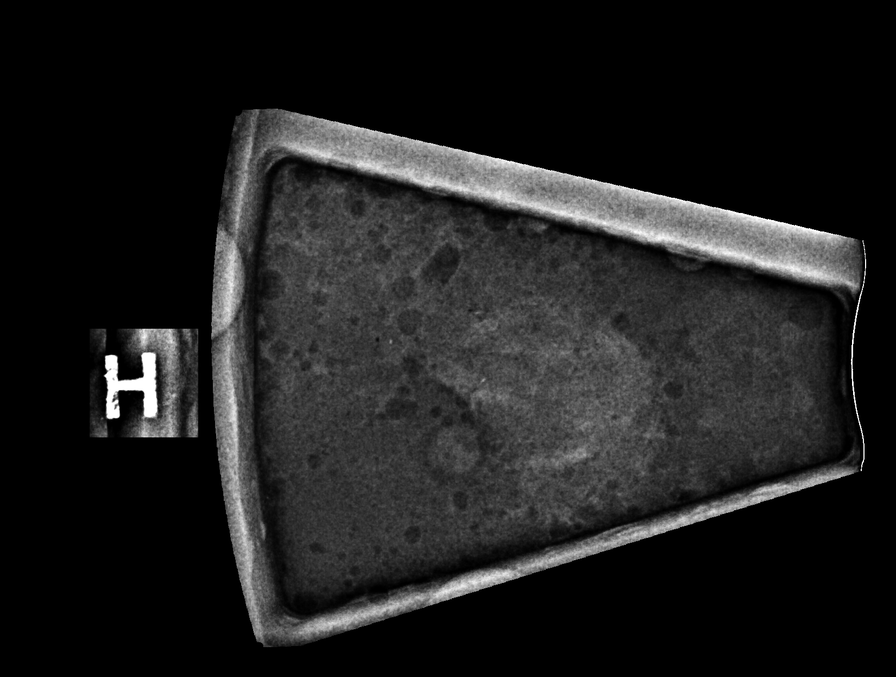

[8 of 28 positions shown; findings below may reference images not displayed]



Using sterile technique and 1% Lidocaine as local anesthetic, under
stereotactic guidance, a 9 gauge vacuum assisted device was used to
perform core needle biopsy of mass in the slightly lateral right
breast using a superior approach.

Lesion quadrant: Lateral right breast

At the conclusion of the procedure, a coil shaped tissue marker clip
was deployed into the biopsy cavity. Follow-up 2-view mammogram was
performed and dictated separately.
IMPRESSION: Stereotactic-guided biopsy of a mass in the lateral right breast. No
apparent complications.

ADDENDUM:
Pathology revealed INTRADUCTAL PAPILLOMA of the Right breast,
lateral, (coil clip). This was found to be concordant by Dr. Tiger
Archel, with excision recommended.

Pathology results were discussed with the patient by telephone. The
patient reported doing well after the biopsy with tenderness at the
site. Post biopsy instructions and care were reviewed and questions
were answered. The patient was encouraged to call The [REDACTED] for any additional concerns. My direct phone
number was provided.

Surgical consultation has been arranged with Dr. Nceba Aico at
[REDACTED], [HOSPITAL] location on June 06, 2021.

Pathology results were called to Ferienhaus Erxleben, RN, with Dr. Erxleben on
June 03, 2021.

Pathology results reported by Fritz Padin, RN on 06/03/2021.



Using sterile technique and 1% Lidocaine as local anesthetic, under
stereotactic guidance, a 9 gauge vacuum assisted device was used to
perform core needle biopsy of mass in the slightly lateral right
breast using a superior approach.

Lesion quadrant: Lateral right breast

At the conclusion of the procedure, a coil shaped tissue marker clip
was deployed into the biopsy cavity. Follow-up 2-view mammogram was
performed and dictated separately.
IMPRESSION: Stereotactic-guided biopsy of a mass in the lateral right breast. No
apparent complications.

## 2022-04-11 ENCOUNTER — Other Ambulatory Visit: Payer: Self-pay | Admitting: Neurology

## 2022-04-14 ENCOUNTER — Encounter: Payer: Self-pay | Admitting: Neurology

## 2022-04-14 NOTE — Telephone Encounter (Signed)
Error

## 2022-05-08 ENCOUNTER — Other Ambulatory Visit: Payer: Self-pay | Admitting: Neurology

## 2022-05-29 ENCOUNTER — Encounter: Payer: Self-pay | Admitting: Neurology

## 2022-05-29 ENCOUNTER — Ambulatory Visit: Payer: Medicare Other | Admitting: Neurology

## 2022-05-29 VITALS — BP 126/80 | HR 91 | Ht 59.0 in | Wt 134.0 lb

## 2022-05-29 DIAGNOSIS — G35 Multiple sclerosis: Secondary | ICD-10-CM

## 2022-05-29 DIAGNOSIS — Z79899 Other long term (current) drug therapy: Secondary | ICD-10-CM | POA: Diagnosis not present

## 2022-05-29 DIAGNOSIS — R413 Other amnesia: Secondary | ICD-10-CM | POA: Diagnosis not present

## 2022-05-29 DIAGNOSIS — R269 Unspecified abnormalities of gait and mobility: Secondary | ICD-10-CM | POA: Diagnosis not present

## 2022-05-29 DIAGNOSIS — R350 Frequency of micturition: Secondary | ICD-10-CM
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
## 2022-05-30 LAB — CBC WITH DIFFERENTIAL/PLATELET
Basophils Absolute: 0 10*3/uL (ref 0.0–0.2)
Basos: 1 %
EOS (ABSOLUTE): 0.1 10*3/uL (ref 0.0–0.4)
Eos: 3 %
Hematocrit: 41.1 % (ref 34.0–46.6)
Hemoglobin: 13.9 g/dL (ref 11.1–15.9)
Immature Grans (Abs): 0 10*3/uL (ref 0.0–0.1)
Immature Granulocytes: 0 %
Lymphocytes Absolute: 0.5 10*3/uL — ABNORMAL LOW (ref 0.7–3.1)
Lymphs: 15 %
MCH: 29.9 pg (ref 26.6–33.0)
MCHC: 33.8 g/dL (ref 31.5–35.7)
MCV: 88 fL (ref 79–97)
Monocytes Absolute: 0.5 10*3/uL (ref 0.1–0.9)
Monocytes: 14 %
Neutrophils Absolute: 2.5 10*3/uL (ref 1.4–7.0)
Neutrophils: 67 %
Platelets: 242 10*3/uL (ref 150–450)
RBC: 4.65 x10E6/uL (ref 3.77–5.28)
RDW: 13.2 % (ref 11.7–15.4)
WBC: 3.7 10*3/uL (ref 3.4–10.8)

## 2022-05-30 LAB — HEPATIC FUNCTION PANEL
ALT: 33 IU/L — ABNORMAL HIGH (ref 0–32)
AST: 23 IU/L (ref 0–40)
Albumin: 4.7 g/dL (ref 3.9–4.9)
Alkaline Phosphatase: 77 IU/L (ref 44–121)
Bilirubin Total: 0.7 mg/dL (ref 0.0–1.2)
Bilirubin, Direct: 0.2 mg/dL (ref 0.00–0.40)
Total Protein: 6.7 g/dL (ref 6.0–8.5)

## 2022-06-05 ENCOUNTER — Other Ambulatory Visit: Payer: Self-pay | Admitting: Neurology

## 2022-07-03 ENCOUNTER — Other Ambulatory Visit: Payer: Self-pay | Admitting: Neurology

## 2022-07-03 NOTE — Telephone Encounter (Signed)
Per Dr. Bonnita Hollow lab results for patient on 05/30/2022: "The lymphocyte count was a little bit low and I would like you to reduce the Vumerity to 1 pill in the morning and 2 at night rather than 2 pills twice a day.  If you get blood work with primary care in the next couple months,  please see if they can fax the results."  It does not appear that patient viewed this on mychart. Received refill request for Vumerity 462mg  BID.  I called patient to confirm her dosage and discuss this results with her if she isn't already aware of them. No answer, left a message asking her to call back. If patient calls back please route to POD 1.

## 2022-07-08 ENCOUNTER — Other Ambulatory Visit: Payer: Self-pay

## 2022-07-08 MED ORDER — VUMERITY 231 MG PO CPDR: 462.0000 mg | DELAYED_RELEASE_CAPSULE | Freq: Two times a day (BID) | ORAL | 11 refills | Status: DC

## 2022-08-13 LAB — LAB REPORT - SCANNED
A1c: 5
EGFR: 72

## 2022-09-30 ENCOUNTER — Other Ambulatory Visit: Payer: Self-pay

## 2022-09-30 DIAGNOSIS — G35 Multiple sclerosis: Secondary | ICD-10-CM

## 2022-09-30 DIAGNOSIS — G35D Multiple sclerosis, unspecified: Secondary | ICD-10-CM

## 2022-09-30 DIAGNOSIS — R269 Unspecified abnormalities of gait and mobility: Secondary | ICD-10-CM

## 2022-09-30 MED ORDER — DALFAMPRIDINE ER 10 MG PO TB12
10.0000 mg | ORAL_TABLET | Freq: Two times a day (BID) | ORAL | 3 refills | Status: DC
Start: 2022-09-30 — End: 2023-09-07

## 2022-12-15 ENCOUNTER — Telehealth: Payer: Self-pay | Admitting: Neurology

## 2022-12-15 NOTE — Telephone Encounter (Signed)
Pt asking for a call back to discuss getting an appt. Need an appt before 05/12/23. Had to cancel appt due to car problems.

## 2022-12-15 NOTE — Telephone Encounter (Signed)
Dr.Sater doesn't have any soon, please call patient and let her know that we can put her on cancellation list and she can call weekly to see if any cancellations. Thanks!

## 2022-12-16 ENCOUNTER — Ambulatory Visit: Payer: Medicare Other | Admitting: Neurology

## 2022-12-18 ENCOUNTER — Telehealth: Payer: Self-pay | Admitting: Neurology

## 2022-12-18 NOTE — Telephone Encounter (Signed)
Pt is asking for a call with Recent blood work results

## 2022-12-18 NOTE — Telephone Encounter (Signed)
Please call pt back. Last labs our office checked was 05/29/22. Did she mean to call another office?

## 2022-12-23 NOTE — Telephone Encounter (Signed)
I called patient and relayed results to patient from 05/29/22, " The lymphocyte count was a little bit low and I would like you to reduce the Vumerity to 1 pill in the morning and 2 at night rather than 2 pills twice a day.  If you get blood work with primary care in the next couple months,  please see if they can fax the results.   Pt was not able to login to see my chart message. She will start taking Vimerity as directed 1 pill in am and 2 pills in pm. Her follow up visit is scheduled on 05/12/23. She wasn't able to come for sooner visit due to car trouble. She insisted I double check with you to make sure you are okay with her coming on 05/12/23.  Please advise

## 2022-12-23 NOTE — Telephone Encounter (Signed)
Pt called and stated she wants to talk to nurse about blood work that was taking 05/29/2022. Pt asked that the nurse please give her a call to discuss

## 2022-12-24 NOTE — Telephone Encounter (Signed)
Patient informed with below. She will get her PCP to recheck CBC with diff and have then send results to Dr.Sater.  pt verbalized she understood all the below,

## 2023-01-06 LAB — LAB REPORT - SCANNED: EGFR: 105

## 2023-01-07 ENCOUNTER — Telehealth: Payer: Self-pay | Admitting: Neurology

## 2023-01-12 NOTE — Telephone Encounter (Signed)
I called patient and told her that we did receive the paper results and it in Dr.Sater in basket to review.

## 2023-01-12 NOTE — Telephone Encounter (Signed)
Pt is asking for a call to discuss her Liver levels being off

## 2023-01-13 ENCOUNTER — Telehealth: Payer: Self-pay | Admitting: *Deleted

## 2023-01-13 NOTE — Telephone Encounter (Signed)
Called pt. Relayed Dr. Felecia Shelling received lab results she completed on 01/06/23 from Laverle Hobby (ordering physician). Lymphoctye count 0.9. (ref range 0.9-3.6). Advised per Dr. Felecia Shelling that he would like her to continue taking Vumerity 231mg , 1 tab po BID for now and have labs rechecked in a couple months. Pt states she already has repeat lab appt scheduled for 02/03/23 and will have results faxed to Dr. Felecia Shelling to review once ready.    Pt also asked about abnormal liver labs. Wondering if Vumerity if the cause of this. Aware I will send back to him for review.

## 2023-01-13 NOTE — Telephone Encounter (Signed)
Called and spoke with pt. Relayed Dr. Sater's message. She verbalized understanding and appreciation. 

## 2023-01-21 ENCOUNTER — Ambulatory Visit: Payer: Medicare Other | Admitting: Allergy and Immunology

## 2023-01-21 VITALS — BP 110/68 | HR 95 | Temp 97.9°F | Resp 16

## 2023-01-21 DIAGNOSIS — B9789 Other viral agents as the cause of diseases classified elsewhere: Secondary | ICD-10-CM | POA: Diagnosis not present

## 2023-01-21 DIAGNOSIS — J988 Other specified respiratory disorders: Secondary | ICD-10-CM | POA: Diagnosis not present

## 2023-01-21 DIAGNOSIS — T63421D Toxic effect of venom of ants, accidental (unintentional), subsequent encounter: Secondary | ICD-10-CM | POA: Diagnosis not present

## 2023-01-21 DIAGNOSIS — J3089 Other allergic rhinitis: Secondary | ICD-10-CM | POA: Diagnosis not present

## 2023-01-21 NOTE — Progress Notes (Unsigned)
[facility] [place] [facility] [place]   Follow-up Note  Referring Provider: Bonnita Nasuti, MD Primary Provider: Bonnita Nasuti, MD Date of Office Visit: 01/21/2023  Subjective:   Cindy Frederick (DOB: 25-Nov-1955) is a 67 y.o. female who returns to the [facility] on 01/21/2023 in re-evaluation of the following:  HPI: [facility] returns to this clinic in evaluation of allergic rhinoconjunctivitis and fire ant hypersensitivity and ETD.  I last saw her in this clinic 24 April 2021.  She has really done well since her last visit.  During her last visit she had significant ETD on the left and we sent her to ENT who documented a mucoid otitis media and recommend that she have ear tubes but fortunately her ear issue cleared within a month of that appointment and she did not require placement of ear tubes.  She is gone through the past year without any significant respiratory tract symptoms and no need to use a systemic steroid or antibiotic for any type of airway or ear issue.  But, approximately 5 days ago she had acute onset of facial pain and her ears hurting and some nasal congestion without any significant sneezing or anosmia or fever or ugly nasal discharge and she is actually little bit better today.  She has been taking Mucinex DM and some Tylenol and restarted her nasal fluticasone.  She does not have an EpiPen for her fire ant hypersensitivity at this point in time.  She has not sure that she will acquire an EpiPen for this issue.  She states that she is "careful" about fire ant exposure.  She does not receive any vaccinations.  Allergies as of 01/21/2023       Reactions   Fire Ant Anaphylaxis   Sulfa Antibiotics Hives   Tramadol Other (See Comments)   Zoster Vaccine Live Other (See Comments)   Betadine [povidone Iodine] Rash   Povidone-iodine Rash        Medication List    amLODipine 10 MG tablet Commonly known as: NORVASC 1 tablet Orally  Once a day for 90 days   Biotin 1000 MCG tablet Take 1,000 mcg by mouth 3 (three) times daily.   cetirizine 10 MG tablet Commonly known as: ZYRTEC Take 10 mg by mouth daily.   cyanocobalamin 1000 MCG/ML injection Commonly known as: VITAMIN B12 INJ 1 ML UTD Q 2 WEEKS   dalfampridine 10 MG Tb12 Take 1 tablet (10 mg total) by mouth 2 (two) times daily.   diazepam 5 MG tablet Commonly known as: Valium Take up to one a day   DULoxetine 60 MG capsule Commonly known as: CYMBALTA TAKE 1 CAPSULE(60 MG) BY MOUTH DAILY   escitalopram 20 MG tablet Commonly known as: LEXAPRO Take 20 mg by mouth daily.   fluticasone 50 MCG/ACT nasal spray Commonly known as: FLONASE SHAKE LQ AND U 1 SPR IEN QD   ibandronate 150 MG tablet Commonly known as: BONIVA Take 150 mg by mouth once a week.   levothyroxine 50 MCG tablet Commonly known as: SYNTHROID Take 50 mcg by mouth daily.   multivitamin-iron-minerals-folic acid chewable tablet Chew 1 tablet by mouth daily.   rosuvastatin 20 MG tablet Commonly known as: CRESTOR Take 20 mg by mouth daily.   VITAMIN D3 PO Take 2,000 Units by mouth daily.   Vumerity 231 MG Cpdr Generic drug: Diroximel Fumarate Take 462 mg by mouth 2 (two) times daily.    Past Medical History:  Diagnosis Date   Chronic kidney disease    Kidney stones    Multiple sclerosis ([facility])    Osteoporosis 2018   Vision abnormalities     Past Surgical History:  Procedure Laterality Date   CHOLECYSTECTOMY, LAPAROSCOPIC     LITHOTRIPSY     WISDOM TOOTH EXTRACTION      Review of systems negative except as noted in HPI / PMHx or noted below:  Review of Systems  Constitutional: Negative.   HENT: Negative.    Eyes: Negative.   Respiratory: Negative.    Cardiovascular: Negative.   Gastrointestinal: Negative.   Genitourinary: Negative.   Musculoskeletal: Negative.   Skin: Negative.   Neurological: Negative.   Endo/Heme/Allergies: Negative.    Psychiatric/Behavioral: Negative.       Objective:   Vitals:   01/21/23 1056  BP: 110/68  Pulse: 95  Resp: 16  Temp: 97.9 F (36.6 C)  SpO2: 99%          Physical Exam Constitutional:      Appearance: She is not diaphoretic.  HENT:     Head: Normocephalic.     Right Ear: Tympanic membrane, ear canal and external ear normal.     Left Ear: Tympanic membrane, ear canal and external ear normal.     Nose: Nose normal. No mucosal edema or rhinorrhea.     Mouth/Throat:     Pharynx: Uvula midline. No oropharyngeal exudate.  Eyes:     Conjunctiva/sclera: Conjunctivae normal.  Neck:     Thyroid: No thyromegaly.     Trachea: Trachea normal. No tracheal tenderness or tracheal deviation.  Cardiovascular:     Rate and Rhythm: Normal rate and regular rhythm.     Heart sounds: Normal heart sounds, S1 normal and S2 normal. No murmur heard. Pulmonary:     Effort: No respiratory distress.     Breath sounds: Normal breath sounds. No stridor. No wheezing or rales.  Lymphadenopathy:     Head:     Right side of head: No tonsillar adenopathy.     Left side of head: No tonsillar adenopathy.     Cervical: No cervical adenopathy.  Skin:    Findings: No erythema or rash.     Nails: There is no clubbing.  Neurological:     Mental Status: She is alert.     Diagnostics: none  Assessment and Plan:   1. Perennial allergic rhinitis   2. Fire ant bite, accidental or unintentional, subsequent encounter   3. Viral respiratory illness    1.  Can use gentle positive pressure inflation of ear when needed  2.  Treat inflammation with prednisone 10 mg - 1 tablet 1 time per day for 5-10 days only  3.  Can continue antihistamine and Mucinex and nasal fluticasone and EpiPen if needed  4.  Return to clinic in 1 year or earlier if problem  Seniqua appears to have acquired a viral respiratory tract infection but relatively mild.  Will give her some systemic steroids to help with her respiratory  tract inflammation using a relatively low-dose for just a few days and I suspect that we will help her with this issue.  I am going to hold off on any antibiotics at this point.  She does not really use her medications on a regular basis send for the most part she does not really need her medications on a regular basis.  I will see her back in this clinic in 1 year or earlier if there is  a problem.  Allena Katz, MD Allergy / Immunology [place]

## 2023-01-21 NOTE — Patient Instructions (Addendum)
  1.  Can use gentle positive pressure inflation of ear when needed  2.  Treat inflammation with prednisone 10 mg - 1 tablet 1 time per day for 5-10 days only  3.  Can continue antihistamine and Mucinex and nasal fluticasone and EpiPen if needed  4.  Return to clinic in 1 year or earlier if problem

## 2023-01-22 ENCOUNTER — Encounter: Payer: Self-pay | Admitting: Allergy and Immunology

## 2023-02-10 ENCOUNTER — Telehealth: Payer: Self-pay | Admitting: Neurology

## 2023-02-10 NOTE — Telephone Encounter (Signed)
Left message for patient to call.

## 2023-02-10 NOTE — Telephone Encounter (Signed)
Pt called stated she is having problem with her right foot it's hot and heavy. Stated she went to PCP and he told her to go get compress sock, stated she just got socks today. Pt stated she doesn't know if this is a MS flare up or not.

## 2023-02-10 NOTE — Telephone Encounter (Signed)
Detailed message left on voicemail per DPR access with Dr.Sater response "This sounds like it was more due to standing on her feet than multiple sclerosis..  I think the compression stockings make sense.  Hopefully she will feel improvement seen"

## 2023-02-10 NOTE — Telephone Encounter (Signed)
Dr.Sater I called patient to discuss right foot complaint. 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 She noticed her right foot appeared to be swollen, no new complaints of left foot. 3 days ago she noticed right foot felt heavy and a slight burning crawling sensation that comes and goes at the top of her foot when sitting only. She spoke with her PCP and was told to buy compression socks she just got those today, currently wearing now. No problems with walking, no foot dragging, no skin color change in right foot, she mentioned it is slightly warm to touch. States the sensation is not painful, her plan is to wear the compression sock for now and keep her feet elevated when sitting to see if this helps. She has a follow up visit scheduled on 05/12/23, she asked me to run this information by you to confirm this is not MS related?   Please advise

## 2023-02-17 ENCOUNTER — Telehealth: Payer: Self-pay | Admitting: Neurology

## 2023-02-17 NOTE — Telephone Encounter (Signed)
Pt stated she is following up on lab result sent from her PCP.

## 2023-02-17 NOTE — Telephone Encounter (Signed)
Called pt and pt said she wants to know how often Dr. Epimenio Foot wants the CBC with Differential lab drawn at her PCP. Told pt Dr. Epimenio Foot will be notified about this matter.

## 2023-02-18 NOTE — Telephone Encounter (Signed)
Called pt and let her know that Dr. Epimenio Foot said that "Since the last lymphocyte count was back in the normal range.  We just need to have it done twice a year.  We can check in at her next visit with [place]." Pt verbalized understanding.

## 2023-03-11 ENCOUNTER — Telehealth: Payer: Self-pay

## 2023-03-11 NOTE — Telephone Encounter (Signed)
Pt called stating that she wants an EMG done on her feet, pt states that she is having burning in both of her feet now and her PCP won't help her get an EMG done. Pt states she is frustrated about the constant pain in her feet and want to know what is going on. Told pt Dr. Epimenio Foot will be notified about this matter and we will back in contact with her with his treatment options.

## 2023-03-12 NOTE — Telephone Encounter (Signed)
Called pt and offered appt today at 1:30pm where Dr. Epimenio Foot had cx. Pt declined. She has appt 05/12/23 at 4pm and will keep this. Asked to be added to wait list. I added.

## 2023-04-02 ENCOUNTER — Telehealth: Payer: Self-pay | Admitting: Allergy and Immunology

## 2023-04-02 MED ORDER — AMOXICILLIN-POT CLAVULANATE 875-125 MG PO TABS: 1.0000 | ORAL_TABLET | Freq: Two times a day (BID) | ORAL | 0 refills | Status: AC

## 2023-04-02 MED ORDER — PREDNISONE 10 MG PO TABS: 10.0000 mg | ORAL_TABLET | Freq: Every day | ORAL | 0 refills | Status: AC

## 2023-04-02 NOTE — Telephone Encounter (Signed)
Prescriptions sent to [facility] on [address]. Patient informed

## 2023-04-02 NOTE — Telephone Encounter (Signed)
Please advice . Thank you

## 2023-04-02 NOTE — Telephone Encounter (Signed)
Patient has been dealing with a lot of sinus pressure and for the past couple of days is having yellow nasal discharge. Her sinuses have been hurting but she is taking Mucinex DM. She did mention that in the past, antibiotics haven't helped.   Best pharmacy-  [facility] on [address]

## 2023-04-13 ENCOUNTER — Ambulatory Visit: Payer: Medicare Other | Admitting: Allergy and Immunology

## 2023-04-13 ENCOUNTER — Encounter: Payer: Self-pay | Admitting: Allergy and Immunology

## 2023-04-13 VITALS — BP 140/72 | HR 80 | Resp 20

## 2023-04-13 DIAGNOSIS — J3089 Other allergic rhinitis: Secondary | ICD-10-CM

## 2023-04-13 DIAGNOSIS — H6993 Unspecified Eustachian tube disorder, bilateral: Secondary | ICD-10-CM | POA: Diagnosis not present

## 2023-04-13 MED ORDER — EPINEPHRINE 0.3 MG/0.3ML IJ SOAJ: INTRAMUSCULAR | 3 refills | Status: AC

## 2023-04-13 NOTE — Progress Notes (Unsigned)
[place] - [place] - [place] - [place] - [place]   Follow-up Note  Referring Provider: Galvin Proffer, MD Primary Provider: Galvin Proffer, MD Date of Office Visit: 04/13/2023  Subjective:   Cindy [facility] (DOB: 10-Sep-1956) is a 67 y.o. female who [facility] in re-evaluation of the following:  HPI: Cindy Frederick returns to this clinic in evaluation of allergic rhinoconjunctivitis and fire ant hypersensitivity.  I last saw her in this clinic 21 January 2023.  During her last visit she appeared to have a viral respiratory tract infection which cleared up with therapy.  But she contacted [place] by telephone approximately 10 days ago with what sounded like bacterial sinusitis for which we gave her Augmentin plus a low-dose of prednisone for 5 days.  She resolved both of those episodes but what she has been having on a chronic basis for a while now is nasal congestion and some postnasal drip and intermittent sore throat and some headache without any raspy voice or throat clearing or ugly nasal discharge or anosmia.  She does have some intermittent ear discomfort as well.  She does not really use her nasal steroid in a preventative manner and can go long stretches of time without using any of this medicine.  She is interested in being evaluated for her allergies during today's visit.  Allergies as of 04/13/2023       Reactions   Fire Ant Anaphylaxis   Sulfa Antibiotics Hives   Tramadol Other (See Comments)   Zoster Vaccine Live Other (See Comments)   Betadine [povidone Iodine] Rash   Povidone-iodine Rash        Medication List    amLODipine 10 MG tablet Commonly known as: NORVASC 1 tablet Orally Once a day for 90 days   Biotin 1000 MCG tablet Take 1,000 mcg by mouth 3 (three) times daily.   cetirizine 10 MG tablet Commonly known as: ZYRTEC Take 10 mg by mouth daily.   cyanocobalamin 1000 MCG/ML injection Commonly known as: VITAMIN  B12 INJ 1 ML UTD Q 2 WEEKS   dalfampridine 10 MG Tb12 Take 1 tablet (10 mg total) by mouth 2 (two) times daily.   escitalopram 20 MG tablet Commonly known as: LEXAPRO Take 20 mg by mouth daily.   fluticasone 50 MCG/ACT nasal spray Commonly known as: FLONASE SHAKE LQ AND U 1 SPR IEN QD   ibandronate 150 MG tablet Commonly known as: BONIVA Take 150 mg by mouth once a week.   levothyroxine 50 MCG tablet Commonly known as: SYNTHROID Take 50 mcg by mouth daily.   multivitamin-iron-minerals-folic acid chewable tablet Chew 1 tablet by mouth daily.   rosuvastatin 20 MG tablet Commonly known as: CRESTOR Take 20 mg by mouth daily.   Vitamin D (Ergocalciferol) 1.25 MG (50000 UNIT) Caps capsule Commonly known as: DRISDOL Take by mouth.   Vumerity 231 MG Cpdr Generic drug: Diroximel Fumarate Take 462 mg by mouth 2 (two) times daily.   Wellbutrin XL 150 MG 24 hr tablet Generic drug: buPROPion 1 tablet in the morning Orally Once a day for 30 days    Past Medical History:  Diagnosis Date   Chronic kidney disease    Kidney stones    Multiple sclerosis ([facility])    Osteoporosis 2018   Vision abnormalities     Past Surgical History:  Procedure Laterality Date   CHOLECYSTECTOMY, LAPAROSCOPIC     LITHOTRIPSY     WISDOM TOOTH EXTRACTION  Review of systems negative except as noted in HPI / PMHx or noted below:  Review of Systems  Constitutional: Negative.   HENT: Negative.    Eyes: Negative.   Respiratory: Negative.    Cardiovascular: Negative.   Gastrointestinal: Negative.   Genitourinary: Negative.   Musculoskeletal: Negative.   Skin: Negative.   Neurological: Negative.   Endo/Heme/Allergies: Negative.   Psychiatric/Behavioral: Negative.       Objective:   Vitals:   04/13/23 1352  BP: (!) 140/72  Pulse: 80  Resp: 20  SpO2: 97%          Physical Exam Constitutional:      Appearance: She is not diaphoretic.  HENT:     Head: Normocephalic.      Right Ear: Tympanic membrane, ear canal and external ear normal.     Left Ear: Tympanic membrane, ear canal and external ear normal.     Nose: Nose normal. No mucosal edema or rhinorrhea.     Mouth/Throat:     Pharynx: Uvula midline. No oropharyngeal exudate.  Eyes:     Conjunctiva/sclera: Conjunctivae normal.  Neck:     Thyroid: No thyromegaly.     Trachea: Trachea normal. No tracheal tenderness or tracheal deviation.  Cardiovascular:     Rate and Rhythm: Normal rate and regular rhythm.     Heart sounds: Normal heart sounds, S1 normal and S2 normal. No murmur heard. Pulmonary:     Effort: No respiratory distress.     Breath sounds: Normal breath sounds. No stridor. No wheezing or rales.  Lymphadenopathy:     Head:     Right side of head: No tonsillar adenopathy.     Left side of head: No tonsillar adenopathy.     Cervical: No cervical adenopathy.  Skin:    Findings: No erythema or rash.     Nails: There is no clubbing.  Neurological:     Mental Status: She is alert.     Diagnostics: Allergy skin tests were performed.  She demonstrated hypersensitivity to  Assessment and Plan:   No diagnosis found.  Patient Instructions   1.  Can use gentle positive pressure inflation of ear when needed  2.  Treat inflammation with fluticasone - 2 sprays each nostril 3-7 times per week  3.  Can continue antihistamine and Mucinex and EpiPen if needed  4.  Return to clinic in 1 year or earlier if problem  [name], MD Allergy / Immunology [facility]

## 2023-04-13 NOTE — Patient Instructions (Addendum)
  1.  Can use gentle positive pressure inflation of ear when needed  2.  Treat inflammation with fluticasone - 2 sprays each nostril 3-7 times per week  3.  Can continue antihistamine and Mucinex and EpiPen if needed  4.  Return to clinic in 1 year or earlier if problem

## 2023-04-14 ENCOUNTER — Encounter: Payer: Self-pay | Admitting: Allergy and Immunology

## 2023-04-29 ENCOUNTER — Ambulatory Visit: Payer: Medicare Other | Admitting: Neurology

## 2023-05-12 ENCOUNTER — Ambulatory Visit: Payer: Medicare Other | Admitting: Neurology

## 2023-05-20 ENCOUNTER — Encounter: Payer: Self-pay | Admitting: *Deleted

## 2023-05-26 LAB — LAB REPORT - SCANNED
A1c: 5.1
EGFR: 85

## 2023-06-04 ENCOUNTER — Telehealth: Payer: Self-pay

## 2023-06-04 NOTE — Telephone Encounter (Signed)
Labs requested from [facility], labs dated 05/29/22 faxed as requested

## 2023-06-17 ENCOUNTER — Telehealth: Payer: Self-pay | Admitting: Allergy and Immunology

## 2023-06-17 ENCOUNTER — Other Ambulatory Visit: Payer: Self-pay | Admitting: Allergy and Immunology

## 2023-06-17 NOTE — Telephone Encounter (Signed)
Please advice . Thank you

## 2023-06-17 NOTE — Telephone Encounter (Signed)
Patient states she has not checked her BP but will do it today. Patient is staying hydrated and has not started any new medications. Patient also states both her ears are really hurting and she feels like she has fluid in them. Patient wants to know if that might be the cause of her feeling dizzy? Please advice.

## 2023-06-17 NOTE — Telephone Encounter (Signed)
Patient states a couple of days ago she sat up too quickly and got very dizzy to the point where she thought she was going to pass out. She later got into her car and had the same feeling all over again. Both of her ears were hurting a couple days ago as well. She started her nasal spray and a steroid. Patient would like to make Dr. Lucie Leather aware and see if he has any suggestions.

## 2023-06-18 MED ORDER — PREDNISONE 10 MG PO TABS: 10.0000 mg | ORAL_TABLET | Freq: Every day | ORAL | 0 refills | Status: DC

## 2023-06-18 NOTE — Telephone Encounter (Signed)
Patient states she is aware that she does not need to take Prednisone but she is wondering if Dr. Lucie Leather can send a prescription for it just to "have on hand".  Best pharmacy-  [facility] on [address]

## 2023-06-18 NOTE — Telephone Encounter (Signed)
Patient will stop by our office today to get her ears checked.

## 2023-06-18 NOTE — Telephone Encounter (Signed)
Prescription sent to [facility] on [address]. LVM informing patient.

## 2023-06-18 NOTE — Telephone Encounter (Signed)
Please advice . Thank you

## 2023-06-23 ENCOUNTER — Encounter: Payer: Self-pay | Admitting: Neurology

## 2023-06-23 ENCOUNTER — Ambulatory Visit: Payer: Medicare Other | Admitting: Neurology

## 2023-06-23 VITALS — BP 121/77 | HR 77 | Wt 141.0 lb

## 2023-06-23 DIAGNOSIS — R269 Unspecified abnormalities of gait and mobility: Secondary | ICD-10-CM

## 2023-06-23 DIAGNOSIS — Z79899 Other long term (current) drug therapy: Secondary | ICD-10-CM | POA: Diagnosis not present

## 2023-06-23 DIAGNOSIS — R350 Frequency of micturition: Secondary | ICD-10-CM

## 2023-06-23 DIAGNOSIS — G35 Multiple sclerosis: Secondary | ICD-10-CM

## 2023-06-23 DIAGNOSIS — R413 Other amnesia: Secondary | ICD-10-CM | POA: Diagnosis not present
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
## 2023-06-23 NOTE — Addendum Note (Signed)
Addended by: Despina Arias A on: 06/23/2023 02:10 PM   Modules accepted: Level of Service

## 2023-06-24 ENCOUNTER — Other Ambulatory Visit: Payer: Self-pay | Admitting: *Deleted

## 2023-06-24 DIAGNOSIS — Z79899 Other long term (current) drug therapy: Secondary | ICD-10-CM

## 2023-06-24 DIAGNOSIS — G35 Multiple sclerosis: Secondary | ICD-10-CM

## 2023-06-24 LAB — HEPATIC FUNCTION PANEL
ALT: 28 IU/L (ref 0–32)
AST: 20 IU/L (ref 0–40)
Albumin: 4.7 g/dL (ref 3.9–4.9)
Alkaline Phosphatase: 86 IU/L (ref 44–121)
Bilirubin Total: 0.6 mg/dL (ref 0.0–1.2)
Bilirubin, Direct: 0.17 mg/dL (ref 0.00–0.40)
Total Protein: 7.1 g/dL (ref 6.0–8.5)

## 2023-06-24 LAB — CBC WITH DIFFERENTIAL/PLATELET
Basophils Absolute: 0.1 10*3/uL (ref 0.0–0.2)
Basos: 1 %
EOS (ABSOLUTE): 0.1 10*3/uL (ref 0.0–0.4)
Eos: 2 %
Hematocrit: 44.1 % (ref 34.0–46.6)
Hemoglobin: 14.5 g/dL (ref 11.1–15.9)
Immature Grans (Abs): 0 10*3/uL (ref 0.0–0.1)
Immature Granulocytes: 0 %
Lymphocytes Absolute: 0.6 10*3/uL — ABNORMAL LOW (ref 0.7–3.1)
Lymphs: 11 %
MCH: 29.8 pg (ref 26.6–33.0)
MCHC: 32.9 g/dL (ref 31.5–35.7)
MCV: 91 fL (ref 79–97)
Monocytes Absolute: 0.8 10*3/uL (ref 0.1–0.9)
Monocytes: 14 %
Neutrophils Absolute: 3.9 10*3/uL (ref 1.4–7.0)
Neutrophils: 72 %
Platelets: 243 10*3/uL (ref 150–450)
RBC: 4.87 x10E6/uL (ref 3.77–5.28)
RDW: 13 % (ref 11.7–15.4)
WBC: 5.4 10*3/uL (ref 3.4–10.8)

## 2023-07-07 ENCOUNTER — Other Ambulatory Visit: Payer: Self-pay | Admitting: *Deleted

## 2023-07-07 DIAGNOSIS — Z79899 Other long term (current) drug therapy: Secondary | ICD-10-CM

## 2023-07-07 DIAGNOSIS — G35 Multiple sclerosis: Secondary | ICD-10-CM

## 2023-07-30 LAB — CBC WITH DIFFERENTIAL/PLATELET
Basophils Absolute: 0 10*3/uL (ref 0.0–0.2)
Basos: 0 %
EOS (ABSOLUTE): 0 10*3/uL (ref 0.0–0.4)
Eos: 0 %
Hematocrit: 44.9 % (ref 34.0–46.6)
Hemoglobin: 15 g/dL (ref 11.1–15.9)
Immature Grans (Abs): 0 10*3/uL (ref 0.0–0.1)
Immature Granulocytes: 0 %
Lymphocytes Absolute: 0.3 10*3/uL — ABNORMAL LOW (ref 0.7–3.1)
Lymphs: 3 %
MCH: 30.1 pg (ref 26.6–33.0)
MCHC: 33.4 g/dL (ref 31.5–35.7)
MCV: 90 fL (ref 79–97)
Monocytes Absolute: 0.2 10*3/uL (ref 0.1–0.9)
Monocytes: 3 %
Neutrophils Absolute: 7.2 10*3/uL — ABNORMAL HIGH (ref 1.4–7.0)
Neutrophils: 94 %
Platelets: 370 10*3/uL (ref 150–450)
RBC: 4.99 x10E6/uL (ref 3.77–5.28)
RDW: 13 % (ref 11.7–15.4)
WBC: 7.7 10*3/uL (ref 3.4–10.8)

## 2023-07-31 ENCOUNTER — Telehealth: Payer: Self-pay | Admitting: Neurology

## 2023-07-31 NOTE — Telephone Encounter (Signed)
The lymphocyte count has drifted further down (0.3) despite [place] lowering the Vumerity to 231 mg twice daily.  Therefore, I have advised her to stop the Vumerity.  I let her know that it can take months, even up to a year or so for the lymphocyte count to get back into the normal range when it is this low.  We will consider remaining off of a disease modifying therapy versus starting a different 1 when the lymphocyte counts are back into the low normal range.  She also reports that some of her liver enzymes were elevated and her primary care is checking some additional labs.  This is unlikely to be due to the Vumerity.

## 2023-08-03 ENCOUNTER — Telehealth: Payer: Self-pay | Admitting: Neurology

## 2023-08-03 MED ORDER — TIZANIDINE HCL 2 MG PO TABS: 2.0000 mg | ORAL_TABLET | Freq: Three times a day (TID) | ORAL | 5 refills | Status: DC

## 2023-08-03 NOTE — Telephone Encounter (Signed)
Called pt back. Having increased pain with the cooler weather. Unsure if its muscle spasms. Worse this year than previous years. Located in both legs. Not on any muscle relaxers. Has never been in the past. Wants to know if there is a medication that can help. Aware I will speak with MD and call her back.

## 2023-08-03 NOTE — Telephone Encounter (Signed)
Per Dr. Epimenio Foot, can try her on tizanidine 2mg  po TID #90, 5 refills. If she tolerates well, an always increase in future if needed.  I called pt back. Relayed recommendation from Dr. Epimenio Foot. She verbalized understanding and agreeable to plan. E=scribed rx to [facility]/[address] per pt request.

## 2023-08-03 NOTE — Telephone Encounter (Signed)
Pt called stating that due to the weather change, her MS pain is getting worse. She would like to know what she can do to alleviate this pain. Please advise.

## 2023-09-07 ENCOUNTER — Other Ambulatory Visit: Payer: Self-pay | Admitting: Neurology

## 2023-09-07 DIAGNOSIS — G35 Multiple sclerosis: Secondary | ICD-10-CM

## 2023-09-07 DIAGNOSIS — R269 Unspecified abnormalities of gait and mobility: Secondary | ICD-10-CM

## 2023-09-07 NOTE — Telephone Encounter (Signed)
Last seen on 06/23/23 Follow up scheduled on 01/05/24

## 2023-12-21 LAB — LAB REPORT - SCANNED
A1c: 5.2
EGFR (Non-African Amer.): 86
Free T4: 0.98 ng/dL
HM Hepatitis Screen: NEGATIVE
TSH: 3.37 (ref 0.41–5.90)

## 2024-01-05 ENCOUNTER — Ambulatory Visit: Payer: Medicare Other | Admitting: Neurology

## 2024-01-11 ENCOUNTER — Telehealth: Payer: Self-pay | Admitting: Physician Assistant

## 2024-01-11 ENCOUNTER — Inpatient Hospital Stay: Attending: Physician Assistant | Admitting: Physician Assistant

## 2024-01-11 ENCOUNTER — Encounter: Payer: Self-pay | Admitting: Physician Assistant

## 2024-01-11 ENCOUNTER — Inpatient Hospital Stay

## 2024-01-11 VITALS — BP 121/70 | HR 72 | Temp 98.1°F | Resp 16 | Ht 58.8 in | Wt 147.9 lb

## 2024-01-11 DIAGNOSIS — G35 Multiple sclerosis: Secondary | ICD-10-CM | POA: Insufficient documentation

## 2024-01-11 DIAGNOSIS — Z803 Family history of malignant neoplasm of breast: Secondary | ICD-10-CM | POA: Insufficient documentation

## 2024-01-11 DIAGNOSIS — D709 Neutropenia, unspecified: Secondary | ICD-10-CM | POA: Insufficient documentation

## 2024-01-11 DIAGNOSIS — R7989 Other specified abnormal findings of blood chemistry: Secondary | ICD-10-CM | POA: Diagnosis not present

## 2024-01-11 LAB — CBC WITH DIFFERENTIAL (CANCER CENTER ONLY)
Abs Immature Granulocytes: 0.02 10*3/uL (ref 0.00–0.07)
Basophils Absolute: 0.1 10*3/uL (ref 0.0–0.1)
Basophils Relative: 1 %
Eosinophils Absolute: 0.2 10*3/uL (ref 0.0–0.5)
Eosinophils Relative: 3 %
HCT: 43.7 % (ref 36.0–46.0)
Hemoglobin: 14.8 g/dL (ref 12.0–15.0)
Immature Granulocytes: 0 %
Lymphocytes Relative: 13 %
Lymphs Abs: 0.8 10*3/uL (ref 0.7–4.0)
MCH: 29 pg (ref 26.0–34.0)
MCHC: 33.9 g/dL (ref 30.0–36.0)
MCV: 85.5 fL (ref 80.0–100.0)
Monocytes Absolute: 0.6 10*3/uL (ref 0.1–1.0)
Monocytes Relative: 10 %
Neutro Abs: 4.5 10*3/uL (ref 1.7–7.7)
Neutrophils Relative %: 73 %
Platelet Count: 280 10*3/uL (ref 150–400)
RBC: 5.11 MIL/uL (ref 3.87–5.11)
RDW: 13.4 % (ref 11.5–15.5)
WBC Count: 6.2 10*3/uL (ref 4.0–10.5)
nRBC: 0 % (ref 0.0–0.2)
nRBC: 0 /100{WBCs}

## 2024-01-11 LAB — TECHNOLOGIST SMEAR REVIEW: RBC MORPHOLOGY: NORMAL

## 2024-01-11 LAB — C-REACTIVE PROTEIN: CRP: 0.5 mg/dL (ref <1.0)

## 2024-01-11 LAB — CMP (CANCER CENTER ONLY)
ALT: 37 U/L (ref 0–44)
AST: 32 U/L (ref 15–41)
Albumin: 4.9 g/dL (ref 3.5–5.0)
Alkaline Phosphatase: 103 U/L (ref 38–126)
Anion gap: 12 (ref 5–15)
BUN: 16 mg/dL (ref 8–23)
CO2: 26 mmol/L (ref 22–32)
Calcium: 9.5 mg/dL (ref 8.9–10.3)
Chloride: 102 mmol/L (ref 98–111)
Creatinine: 0.9 mg/dL (ref 0.44–1.00)
GFR, Estimated: >60 mL/min (ref >60)
Glucose, Bld: 96 mg/dL (ref 70–99)
Potassium: 3.6 mmol/L (ref 3.5–5.1)
Sodium: 140 mmol/L (ref 135–145)
Total Bilirubin: 0.8 mg/dL (ref 0.0–1.2)
Total Protein: 7.5 g/dL (ref 6.5–8.1)

## 2024-01-11 LAB — HEPATITIS B SURFACE ANTIBODY,QUALITATIVE: Hep B S Ab: NONREACTIVE

## 2024-01-11 LAB — VITAMIN B12: Vitamin B-12: 477 pg/mL (ref 180–914)

## 2024-01-11 LAB — IRON AND TIBC
Iron: 95 ug/dL (ref 28–170)
Saturation Ratios: 20 % (ref 10.4–31.8)
TIBC: 472 ug/dL — ABNORMAL HIGH (ref 250–450)
UIBC: 377 ug/dL

## 2024-01-11 LAB — FERRITIN: Ferritin: 21 ng/mL (ref 11–307)

## 2024-01-11 LAB — HEPATITIS C ANTIBODY: HCV Ab: NONREACTIVE

## 2024-01-11 LAB — HIV ANTIBODY (ROUTINE TESTING W REFLEX): HIV Screen 4th Generation wRfx: NONREACTIVE

## 2024-01-11 LAB — SEDIMENTATION RATE: Sed Rate: 6 mm/h (ref 0–22)

## 2024-01-11 LAB — HEPATITIS B SURFACE ANTIGEN: Hepatitis B Surface Ag: NONREACTIVE

## 2024-01-11 NOTE — Progress Notes (Signed)
 [facility]  INITIAL CONSULT NOTE  Patient Care Team: [place], Myrene Galas, MD as PCP - General (Internal Medicine)  Hematological/Oncological History Labs from PCP, Dr. Angelina Pih: 02/05/2023: Hepatitis panel negative 05/25/2023-WBC 4.1, Hgb 14.5, Plt 236, ANC 2.5, B12 1649 (H), Iron 81, Ferritin 42, Folate 22.30 12/21/2023-WBC 3.4 (L), Hgb 14.1, Plt 240, ANC 1.9 (L), B12 665, Iron 50 (L), Ferritin 32, Folate 17.50 01/05/2024-WBC 3.2 (L), Hgb 13.4, Plt 222, ANC 1.5 (L) 01/11/2024-Establish care with [facility]  CHIEF COMPLAINTS/PURPOSE OF CONSULTATION:  Leukopenia/Neutropenia  HISTORY OF PRESENTING ILLNESS:  Cindy Frederick 68 y.o. female with medical history significant for multiple sclerosis, osteoporosis and ulcerative colitis s/p colectomy with colostomy in place who presents to the hematology clinic for evaluation for neutropenia/leukopenia. She is unaccompanied for this visit.   On exam today, Cindy Frederick reports that she does have some fatigue but is able to complete all her ADLs on her own. She reports that she has some right knee pain that has been more noticeable since discontinuing her MS medication back in the Fall 2024. She is otherwise feeling well without any new or concerning symptoms. She has a good appetite without any noticeable weight loss. She denies nausea, vomiting or abdominal pain. She has good output from her colostomy. She denies easy bruising or signs of active bleeding. She has noticed a red, nonpruritic rash involving her left flank for the last 2-3 months. The rash has not worsened and she denies use of any new lotions/soaps/detergents. She denies any recent infections including URI's or UTI's. She denies fevers, chills, sweats, shortness of breath, chest pain or cough. She has no other complaints. Rest of the 10 point ROS is below.   MEDICAL HISTORY:  Past Medical History:  Diagnosis Date   Chronic kidney disease    Kidney stones    Multiple  sclerosis (HCC)    Osteoporosis 2018   Ulcerative colitis (HCC)    Vision abnormalities     SURGICAL HISTORY: Past Surgical History:  Procedure Laterality Date   CHOLECYSTECTOMY, LAPAROSCOPIC     COLOSTOMY     LITHOTRIPSY     WISDOM TOOTH EXTRACTION      SOCIAL HISTORY: Social History   Socioeconomic History   Marital status: Widowed    Spouse name: Not on file   Number of children: Not on file   Years of education: Not on file   Highest education level: Not on file  Occupational History   Not on file  Tobacco Use   Smoking status: Never   Smokeless tobacco: Never  Vaping Use   Vaping status: Never Used  Substance and Sexual Activity   Alcohol use: No    Alcohol/week: 0.0 standard drinks of alcohol   Drug use: No   Sexual activity: Not on file  Other Topics Concern   Not on file  Social History Narrative   Not on file   Social Drivers of Health   Financial Resource Strain: Not on file  Food Insecurity: Not on file  Transportation Needs: Not on file  Physical Activity: Not on file  Stress: Not on file  Social Connections: Not on file  Intimate Partner Violence: Not on file    FAMILY HISTORY: Family History  Problem Relation Age of Onset   Breast cancer Mother        diagnosed early 81's   Hypertension Mother    Diabetes type II Mother    Kidney disease Mother    Congestive Heart  Failure Mother    Gout Mother    Heart disease Father    Hypertension Father    Seizures Father    Lymphoma Father    Epilepsy Father     ALLERGIES:  is allergic to fire ant, sulfa antibiotics, ambien [zolpidem], tramadol, betadine [povidone iodine], and povidone-iodine.  MEDICATIONS:  Current Outpatient Medications  Medication Sig Dispense Refill   amLODipine (NORVASC) 10 MG tablet 1 tablet Orally Once a day for 90 days     cetirizine (ZYRTEC) 10 MG tablet Take 10 mg by mouth daily.     dalfampridine 10 MG TB12 TAKE 1 TABLET BY MOUTH 2 TIMES DAILY. 180 tablet 2    EPINEPHrine (EPIPEN 2-PAK) 0.3 mg/0.3 mL IJ SOAJ injection Use as directed for life-threatening allergic reaction. 2 each 3   escitalopram (LEXAPRO) 20 MG tablet Take 20 mg by mouth daily.     fluticasone (FLONASE) 50 MCG/ACT nasal spray SHAKE LQ AND U 1 SPR IEN QD  2   ibandronate (BONIVA) 150 MG tablet Take 150 mg by mouth once a week.     levothyroxine (SYNTHROID) 50 MCG tablet Take 50 mcg by mouth daily.     rosuvastatin (CRESTOR) 20 MG tablet Take 20 mg by mouth daily.     Vitamin D, Ergocalciferol, (DRISDOL) 1.25 MG (50000 UNIT) CAPS capsule Take by mouth.     WELLBUTRIN XL 150 MG 24 hr tablet 1 tablet in the morning Orally Once a day for 30 days     No current facility-administered medications for this visit.    REVIEW OF SYSTEMS:   Constitutional: ( - ) fevers, ( - )  chills , ( - ) night sweats Eyes: ( - ) blurriness of vision, ( - ) double vision, ( - ) watery eyes Ears, nose, mouth, throat, and face: ( - ) mucositis, ( - ) sore throat Respiratory: ( - ) cough, ( - ) dyspnea, ( - ) wheezes Cardiovascular: ( - ) palpitation, ( - ) chest discomfort, ( - ) lower extremity swelling Gastrointestinal:  ( - ) nausea, ( - ) heartburn, ( - ) change in bowel habits Skin: ( + ) abnormal skin rashes Lymphatics: ( - ) new lymphadenopathy, ( - ) easy bruising Neurological: ( - ) numbness, ( - ) tingling, ( - ) new weaknesses Behavioral/Psych: ( - ) mood change, ( - ) new changes  All other systems were reviewed with the patient and are negative.  PHYSICAL EXAMINATION: ECOG PERFORMANCE STATUS: 1 - Symptomatic but completely ambulatory  Vitals:   01/11/24 0920  BP: 121/70  Pulse: 72  Resp: 16  Temp: 98.1 F (36.7 C)  SpO2: 100%   Filed Weights   01/11/24 0920  Weight: 147 lb 14.4 oz (67.1 kg)    GENERAL: well appearing female in NAD  SKIN: skin color, texture, turgor are normal, no significant lesions. Erythematous, non raised rash on left flank.  EYES: conjunctiva are pink and  non-injected, sclera clear OROPHARYNX: no exudate, no erythema; lips, buccal mucosa, and tongue normal  NECK: supple, non-tender LUNGS: clear to auscultation and percussion with normal breathing effort HEART: regular rate & rhythm and no murmurs and no lower extremity edema Musculoskeletal: no cyanosis of digits and no clubbing  PSYCH: alert & oriented x 3, fluent speech NEURO: no focal motor/sensory deficits  LABORATORY DATA:  I have reviewed the data as listed    Latest Ref Rng & Units 01/11/2024   10:05 AM 07/29/2023   10:29  AM 06/23/2023   11:41 AM  CBC  WBC 4.0 - 10.5 K/uL 6.2  7.7  5.4   Hemoglobin 12.0 - 15.0 g/dL 16.1  09.6  04.5   Hematocrit 36.0 - 46.0 % 43.7  44.9  44.1   Platelets 150 - 400 K/uL 280  370  243        Latest Ref Rng & Units 01/11/2024   10:05 AM 06/23/2023   11:41 AM 05/29/2022    1:35 PM  CMP  Glucose 70 - 99 mg/dL 96     BUN 8 - 23 mg/dL 16     Creatinine 4.09 - 1.00 mg/dL 8.11     Sodium 914 - 782 mmol/L 140     Potassium 3.5 - 5.1 mmol/L 3.6     Chloride 98 - 111 mmol/L 102     CO2 22 - 32 mmol/L 26     Calcium 8.9 - 10.3 mg/dL 9.5     Total Protein 6.5 - 8.1 g/dL 7.5  7.1  6.7   Total Bilirubin 0.0 - 1.2 mg/dL 0.8  0.6  0.7   Alkaline Phos 38 - 126 U/L 103  86  77   AST 15 - 41 U/L 32  20  23   ALT 0 - 44 U/L 37  28  33     ASSESSMENT & PLAN Cindy Frederick is a 68 y.o. female who presents to the hematology clinic for evaluation of leukopenia/neutropenia.   The differentials for neutropenia include benign ethnic neutropenia, infectious etiology, nutritional etiology, inflammatory etiology, or medication.  The patient is not currently taking any medications known to cause neutropenia.  We will order viral studies with hep B, hep C, and HIV as well.  In addition, we will check for nutritional anemias.    # Leukopenia/Neutropenia  --Rule out infectious etiology with hepatitis B serologies, hepatitis C serologies, and HIV.  --Nutritional  evaluation was performed with labs on 12/21/2023 with vitamin B12 and folate levels showing no deficiences. Will recheck vitamin B12 and MMA levels.  --Inflammatory evaluation with ESR and CRP.   --Repeat CBC and CMP.  Additionally, we will order peripheral blood film.  --RTC based on above workup.   #Borderline iron deficiency: --Seen on outside labs from 12/21/2023. Repeat iron panel today.    Orders Placed This Encounter  Procedures   CBC with Differential (Cancer Center Only)    Standing Status:   Future    Number of Occurrences:   1    Expiration Date:   01/10/2025   CMP (Cancer Center only)    Standing Status:   Future    Number of Occurrences:   1    Expiration Date:   01/10/2025   Ferritin    Standing Status:   Future    Number of Occurrences:   1    Expiration Date:   01/10/2025   Iron and TIBC ([facility] ONLY)    Standing Status:   Future    Number of Occurrences:   1    Expiration Date:   01/10/2025   Technologist smear review    Clinical information::   neutropenia   C-reactive protein    Standing Status:   Future    Number of Occurrences:   1    Expiration Date:   01/10/2025   Sedimentation rate    Standing Status:   Future    Number of Occurrences:   1    Expiration Date:   01/10/2025  Hepatitis C antibody    Standing Status:   Future    Number of Occurrences:   1    Expiration Date:   01/10/2025   Hepatitis B surface antibody    Standing Status:   Future    Number of Occurrences:   1    Expiration Date:   01/10/2025   Hepatitis B surface antigen    Standing Status:   Future    Number of Occurrences:   1    Expiration Date:   01/10/2025   Hepatitis B core antibody, total    Standing Status:   Future    Number of Occurrences:   1    Expiration Date:   01/10/2025   HIV antibody (with reflex)    Standing Status:   Future    Number of Occurrences:   1    Expiration Date:   01/10/2025   Vitamin B12    Standing Status:   Future    Number of  Occurrences:   1    Expiration Date:   01/10/2025   Methylmalonic acid, serum    Standing Status:   Future    Number of Occurrences:   1    Expiration Date:   01/10/2025    All questions were answered. The patient knows to call the clinic with any problems, questions or concerns.  I have spent a total of 60 minutes minutes of face-to-face and non-face-to-face time, preparing to see the patient, obtaining and/or reviewing separately obtained history, performing a medically appropriate examination, counseling and educating the patient, ordering medications/tests/procedures, referring and communicating with other health care professionals, documenting clinical information in the electronic health record, independently interpreting results and communicating results to the patient, and care coordination.   Georga Kaufmann, PA-C Department of Hematology/Oncology [facility] at [facility]

## 2024-01-11 NOTE — Telephone Encounter (Signed)
 Patient has been scheduled for follow-up visit per 01/11/24 LOS.  Pt given an appt calendar with date and time.

## 2024-01-12 LAB — HEPATITIS B CORE ANTIBODY, TOTAL: HEP B CORE AB: NEGATIVE

## 2024-01-17 LAB — METHYLMALONIC ACID, SERUM: Methylmalonic Acid, Quantitative: 171 nmol/L (ref 0–378)

## 2024-01-18 ENCOUNTER — Telehealth: Payer: Self-pay | Admitting: Physician Assistant

## 2024-01-18 MED ORDER — SLOW IRON 160 (50 FE) MG PO TBCR: 160.0000 mg | EXTENDED_RELEASE_TABLET | Freq: Every day | ORAL | 6 refills | Status: DC

## 2024-01-18 NOTE — Telephone Encounter (Signed)
 I called Cindy Frederick to review lab results from 01/11/2024.  Fortunately her white blood cell count is back to normal and rest of the CBC is unremarkable.  Remaining workup was unremarkable except for underlying borderline iron deficiency with a ferritin level of 21 (goal of 30 or higher).  Patient admits that she has minimal meat intake in her diet. She denies any signs of bleeding including hematochezia, melena or epistaxis.   Recommend to start PO iron therapy once daily. Sent a prescription to pharmacy on file. Consider GI referral if iron deficiency doesn't improve or worsens with PO supplementation.

## 2024-01-23 ENCOUNTER — Encounter: Payer: Self-pay | Admitting: Neurology

## 2024-01-25 ENCOUNTER — Other Ambulatory Visit: Payer: Self-pay

## 2024-01-25 MED ORDER — SLOW IRON 160 (50 FE) MG PO TBCR: 160.0000 mg | EXTENDED_RELEASE_TABLET | Freq: Every day | ORAL | 6 refills | Status: AC

## 2024-02-05 NOTE — Progress Notes (Signed)
 [name], MD [facility] [address] [place], [place], [address] P. [phone]   Date of Service:02/05/24    Assessment: History of nephrolithiasis   Plan: RTC as needed   I have personally spent 25 minutes involved in face-to-face and non-face-to-face activities for this patient on the day of the visit.  Professional time spent includes the following activities, in addition to those noted in the documentation: face-to-face obtaining history and performing examination, review of office notes from other physicians or providers, personally reviewing labs, imaging, counseling and educating the patient/family, ordering tests/treatments/procedures, care coordination, and/or documentation of the visit.  PCP:  Cindy Reese, MD  CC: Abdominal pain  HPI:  68 y.o. female with history of recurrent nephrolithiasis and ulcerative colitis s/p colostomy who presents for follow-up  Patient reports several months of intermittent left lower quadrant pain radiating to left flank.  She denies any gross hematuria, fevers, chills, nausea, dysuria  Previously followed by Dr. Terence.  Most recent stone surgery was laser lithotripsy in April 2022  I reviewed noncontrast CT abdomen pelvis performed today which failed to demonstrate any nephrolithiasis, hydronephrosis or renal masses.    LAB RESULTS REVIEWED: Most recent serum creatinine level: Lab Results  Component Value Date/Time   CREATININE 0.94 02/07/2021 04:28 PM    PSA levels: No components found for: PSAG  Serum testosterone levels: No components found for: TESTR No components found for: FTT   PMH:  Past Medical History:  Diagnosis Date  . Chronic kidney disease   . Depression   . Kidney stone   . MS (multiple sclerosis)    (CMD)   . Osteoporosis   . Thyroid  disease   . Ulcerative colitis    (CMD)      PSH: Past Surgical History:  Procedure Laterality Date  .  CHOLECYSTECTOMY     Procedure: CHOLECYSTECTOMY  . COLON SURGERY     Procedure: COLON SURGERY  . CYSTOSCOPY W/ STONE MANIPULATION Right 02/12/2021   Procedure: CYSTOSCOPY W/ STONE MANIPULATION, right laser lithotripsy, right retrogrades, right JJ stent placement;  Surgeon: Deward Elsie Terence, MD;  Location: [facility];  Service: Urology;  Laterality: Right;  c-arm, holmium  . KIDNEY STONE SURGERY     Procedure: KIDNEY STONE SURGERY  . LITHOTRIPSY     Procedure: LITHOTRIPSY  . LITHOTRIPSY Left 06/01/2019   Procedure: LASER LITHOTRIPSY URETER;  Surgeon: Deward Elsie Terence, MD;  Location: [facility]IN OR;  Service: Urology;  Laterality: Left;  . WISDOM TOOTH EXTRACTION     Procedure: WISDOM TOOTH EXTRACTION       Medications: Current Outpatient Medications  Medication Sig Dispense Refill  . amLODIPine (NORVASC) 5 mg tablet Take 5 mg by mouth Once Daily.    . Ampyra  10 mg Take 10 mg by mouth 2 (two) times a day.    . biotin (MERIBIN) 1 mg tab tablet Take 1,000 mcg by mouth 3 (three) times a day.    . cetirizine (ZyrTEC) 10 mg tablet Take 10 mg by mouth daily.    . cholecalciferol (VITAMIN D3) 1,000 unit (25 mcg) tablet Take  by mouth.    . cyanocobalamin  (VITAMIN B12) 1,000 mcg/mL injection INJECT 1 ML IN THE MUSCLE FOR 28 DAYS    . DULoxetine  (CYMBALTA ) 60 mg capsule TAKE 1 CAPSULE BY MOUTH DAILY 90 capsule 0  . EPINEPHrine  (EPIPEN ) 0.3 mg/0.3 mL injection syringe Use as directed for life-threatening allergic reaction.    . ergocalciferol  (VITAMIN D2) 1,250  mcg (50,000 unit) capsule Take by mouth.    . escitalopram (LEXAPRO) 10 mg tablet 1 tablet    . fluticasone propionate (FLONASE) 50 mcg/spray nasal spray by Nasal route.    . ibandronate (BONIVA) 150 mg tablet     . levoFLOXacin (LEVAQUIN) 500 mg tablet TAKE 1 TABLET BY MOUTH EVERY DAY FOR 5 DAYS    . levothyroxine (SYNTHROID) 25 mcg tablet 1 tablet in the morning on an empty stomach    . metroNIDAZOLE (METROCREAM) 0.75 %  cream APPLY TWICE DAILY TO FACE    . multivitamin (THERAGRAN) tab tablet Take 1 tablet by mouth Once Daily.    . natalizumab (Tysabri) 300 mg/15 mL injection Infuse  into a venous catheter.    . ondansetron (ZOFRAN-ODT) 4 mg disintegrating tablet DISSOLVE 1 TABLET ON THE TONGUE EVERY DAY    . Promiseb crea APPLY TO THE AFFECTED AREA(S) TWICE DAILY FOR 1 TO 2 WEEKS    . rosuvastatin (CRESTOR) 20 mg tablet     . Wellbutrin  XL 150 mg 24 hr tablet Take 150 mg by mouth every morning.    SABRA oxybutynin (DITROPAN) 5 mg tablet Take 5 mg by mouth 2 (two) times a day for 10 days. 20 tablet 0  . senna (SENOKOT) 8.6 mg tablet Take 1 tablet by mouth Once Daily for 7 days. 7 tablet 0   No current facility-administered medications for this visit.    Allergies: Fire ant, Sulfa (sulfonamide antibiotics), Zolpidem, Tramadol, Zoster vaccine live, and Povidone-iodine   Social History: Patient  reports that she has never smoked. She has never used smokeless tobacco. She reports that she does not currently use alcohol. She reports that she does not use drugs.   Family History: The patient's family history includes Breast cancer in her mother; Congenital adrenal hyperplasia in her mother; Diabetes in her mother; Gout in her mother; Heart disease in her father and mother; Hypertension in her mother; Lymphoma in her father; Seizures in her father.  Review of Systems: A complete review of systems was obtained including: Constitutional, Eyes, ENT, Cardiovascular, Respiratory, GI, GU, Musculoskeletal, Skin, Neurological, Psychiatric, Endocrine, Heme/Lymphatic, and Allergic/Immunologic systems. It is negative or non-contributory to the patient's management except for as stated in this note.  Physical Exam: BP 140/87   Pulse 81   Temp 97.3 F (36.3 C) (Oral)   Resp 14   Ht 1.499 m (4' 11)   Wt 63.5 kg (140 lb)   BMI 28.28 kg/m  GENERAL APPEARANCE:  Well appearing, well developed, well nourished, NAD HEENT:  Atraumatic, Normocephalic LUNGS: Normal  respiratory effort on room air HEART: no obvious cyanosis EXTREMITIES: Moves all extremities well.  Without edema. NEUROLOGIC:  Alert and oriented x 3, CN II-XII grossly intact.  MENTAL STATUS:  Appropriate. SKIN:  Warm, dry and intact.     Results:   Urinalysis and laboratory results, if present, reviewed and analyzed. Recent Results (from the past 24 hours)  Urinalysis with Reflex to Microscopic   Collection Time: 02/05/24  2:06 PM  Result Value Ref Range   Color, Urine Yellow Yellow   Clarity, Urine Clear Clear   Specific Gravity, Urine 1.020 1.002 - 1.030   pH, Urine 5.5 5.0 - 8.0   Protein, Urine Negative Negative, Trace mg/dL   Glucose, Urine Negative Negative mg/dL   Ketones, Urine Negative Negative mg/dL   Bilirubin, Urine Negative Negative   Blood, Urine Trace (A) Negative   Nitrite, Urine Negative Negative   Leukocyte Esterase, Urine Negative  Negative   Urobilinogen, Urine 0.2 <2.0 mg/dL  Urinalysis, Microscopic Only   Collection Time: 02/05/24  2:06 PM  Result Value Ref Range   WBC, Urine None Seen <3 /HPF   RBC, Urine 0-2 <3 /HPF   Bacteria, Urine None Seen None Seen /HPF     Proper PPE worn during the visit.  Thank you very much for allowing me to participate in the care of this patient.

## 2024-02-08 NOTE — Telephone Encounter (Signed)
 Received message via EPIC that pt had not read mychart. I called pt and relayed message. She verbalized understanding.

## 2024-04-18 ENCOUNTER — Encounter: Payer: Self-pay | Admitting: Nurse Practitioner

## 2024-05-25 ENCOUNTER — Other Ambulatory Visit: Payer: Self-pay | Admitting: *Deleted

## 2024-05-25 DIAGNOSIS — R269 Unspecified abnormalities of gait and mobility: Secondary | ICD-10-CM

## 2024-05-25 DIAGNOSIS — G35 Multiple sclerosis: Secondary | ICD-10-CM

## 2024-05-25 MED ORDER — DALFAMPRIDINE ER 10 MG PO TB12: 10.0000 mg | ORAL_TABLET | Freq: Two times a day (BID) | ORAL | 0 refills | Status: DC

## 2024-05-25 NOTE — Telephone Encounter (Signed)
 Last seen on 06/23/23 Follow up scheduled on 08/03/24

## 2024-07-08 LAB — LAB REPORT - SCANNED
A1c: 5.2
EGFR: 102
Free T4: 1.18 ng/dL
TSH: 2.47 (ref 0.41–5.90)

## 2024-07-13 ENCOUNTER — Inpatient Hospital Stay

## 2024-07-13 ENCOUNTER — Telehealth: Payer: Self-pay | Admitting: Hematology and Oncology

## 2024-07-13 ENCOUNTER — Encounter: Payer: Self-pay | Admitting: Hematology and Oncology

## 2024-07-13 ENCOUNTER — Inpatient Hospital Stay: Attending: Hematology and Oncology | Admitting: Hematology and Oncology

## 2024-07-13 ENCOUNTER — Other Ambulatory Visit: Payer: Self-pay

## 2024-07-13 ENCOUNTER — Other Ambulatory Visit: Payer: Self-pay | Admitting: Hematology and Oncology

## 2024-07-13 VITALS — BP 130/75 | HR 79 | Temp 98.3°F | Resp 18 | Ht 58.8 in | Wt 149.1 lb

## 2024-07-13 DIAGNOSIS — K5190 Ulcerative colitis, unspecified, without complications: Secondary | ICD-10-CM | POA: Diagnosis not present

## 2024-07-13 DIAGNOSIS — D709 Neutropenia, unspecified: Secondary | ICD-10-CM | POA: Insufficient documentation

## 2024-07-13 DIAGNOSIS — Z933 Colostomy status: Secondary | ICD-10-CM | POA: Diagnosis not present

## 2024-07-13 DIAGNOSIS — E611 Iron deficiency: Secondary | ICD-10-CM | POA: Diagnosis not present

## 2024-07-13 DIAGNOSIS — R5383 Other fatigue: Secondary | ICD-10-CM | POA: Insufficient documentation

## 2024-07-13 DIAGNOSIS — R21 Rash and other nonspecific skin eruption: Secondary | ICD-10-CM | POA: Insufficient documentation

## 2024-07-13 DIAGNOSIS — G35 Multiple sclerosis: Secondary | ICD-10-CM | POA: Insufficient documentation

## 2024-07-13 LAB — IRON AND TIBC
Iron: 78 ug/dL (ref 28–170)
Saturation Ratios: 20 % (ref 10.4–31.8)
TIBC: 393 ug/dL (ref 250–450)
UIBC: 316 ug/dL

## 2024-07-13 LAB — CBC WITH DIFFERENTIAL (CANCER CENTER ONLY)
Abs Immature Granulocytes: 0.02 K/uL (ref 0.00–0.07)
Basophils Absolute: 0.1 K/uL (ref 0.0–0.1)
Basophils Relative: 1 %
Eosinophils Absolute: 0.2 K/uL (ref 0.0–0.5)
Eosinophils Relative: 2 %
HCT: 44 % (ref 36.0–46.0)
Hemoglobin: 14.9 g/dL (ref 12.0–15.0)
Immature Granulocytes: 0 %
Lymphocytes Relative: 15 %
Lymphs Abs: 1 K/uL (ref 0.7–4.0)
MCH: 29.3 pg (ref 26.0–34.0)
MCHC: 33.9 g/dL (ref 30.0–36.0)
MCV: 86.4 fL (ref 80.0–100.0)
Monocytes Absolute: 0.7 K/uL (ref 0.1–1.0)
Monocytes Relative: 10 %
Neutro Abs: 4.8 K/uL (ref 1.7–7.7)
Neutrophils Relative %: 72 %
Platelet Count: 274 K/uL (ref 150–400)
RBC: 5.09 MIL/uL (ref 3.87–5.11)
RDW: 13.2 % (ref 11.5–15.5)
WBC Count: 6.8 K/uL (ref 4.0–10.5)
nRBC: 0 % (ref 0.0–0.2)

## 2024-07-13 LAB — CMP (CANCER CENTER ONLY)
ALT: 28 U/L (ref 0–44)
AST: 23 U/L (ref 15–41)
Albumin: 4.5 g/dL (ref 3.5–5.0)
Alkaline Phosphatase: 121 U/L (ref 38–126)
Anion gap: 14 (ref 5–15)
BUN: 24 mg/dL — ABNORMAL HIGH (ref 8–23)
CO2: 22 mmol/L (ref 22–32)
Calcium: 9.9 mg/dL (ref 8.9–10.3)
Chloride: 102 mmol/L (ref 98–111)
Creatinine: 1.01 mg/dL — ABNORMAL HIGH (ref 0.44–1.00)
GFR, Estimated: >60 mL/min (ref >60)
Glucose, Bld: 94 mg/dL (ref 70–99)
Potassium: 4 mmol/L (ref 3.5–5.1)
Sodium: 137 mmol/L (ref 135–145)
Total Bilirubin: 0.7 mg/dL (ref 0.0–1.2)
Total Protein: 7.9 g/dL (ref 6.5–8.1)

## 2024-07-13 LAB — FOLATE: Folate: 7.1 ng/mL (ref >5.9)

## 2024-07-13 LAB — FERRITIN: Ferritin: 21 ng/mL (ref 11–307)

## 2024-07-13 LAB — VITAMIN B12: Vitamin B-12: 609 pg/mL (ref 180–914)

## 2024-07-13 LAB — TSH: TSH: 3.75 u[IU]/mL (ref 0.350–4.500)

## 2024-07-13 NOTE — Progress Notes (Signed)
 [facility]  INITIAL CONSULT NOTE  Patient Care Team: Pia Kerney SQUIBB, MD as PCP - General (Internal Medicine)  Hematological/Oncological History Labs from PCP, Dr. Kerney Footman: 02/05/2023: Hepatitis panel negative 05/25/2023-WBC 4.1, Hgb 14.5, Plt 236, ANC 2.5, B12 1649 (H), Iron  81, Ferritin 42, Folate 22.30 12/21/2023-WBC 3.4 (L), Hgb 14.1, Plt 240, ANC 1.9 (L), B12 665, Iron  50 (L), Ferritin 32, Folate 17.50 01/05/2024-WBC 3.2 (L), Hgb 13.4, Plt 222, ANC 1.5 (L) 01/11/2024-Establish care with [facility]  CHIEF COMPLAINTS/PURPOSE OF CONSULTATION:  Leukopenia/Neutropenia  HISTORY OF PRESENTING ILLNESS:  Cindy Frederick 68 y.o. female with medical history significant for multiple sclerosis, osteoporosis and ulcerative colitis s/p colectomy with colostomy in place who presents to the hematology clinic for evaluation for neutropenia/leukopenia. She is unaccompanied for this visit.   On exam today, Cindy Frederick reports that she does have some fatigue but is able to complete all her ADLs on her own. She reports that she has some right knee pain that has been more noticeable since discontinuing her MS medication back in the Fall 2024. She is otherwise feeling well without any new or concerning symptoms. She has a good appetite without any noticeable weight loss. She denies nausea, vomiting or abdominal pain. She has good output from her colostomy. She denies easy bruising or signs of active bleeding. She has noticed a red, nonpruritic rash involving her left flank for the last 2-3 months. The rash has not worsened and she denies use of any new lotions/soaps/detergents. She denies any recent infections including URI's or UTI's. She denies fevers, chills, sweats, shortness of breath, chest pain or cough. She has no other complaints. Rest of the 10 point ROS is below.   MEDICAL HISTORY:  Past Medical History:  Diagnosis Date   Chronic kidney disease    Kidney stones    Multiple  sclerosis    Osteoporosis 2018   Ulcerative colitis (HCC)    Vision abnormalities     SURGICAL HISTORY: Past Surgical History:  Procedure Laterality Date   CHOLECYSTECTOMY, LAPAROSCOPIC     COLOSTOMY     LITHOTRIPSY     WISDOM TOOTH EXTRACTION      SOCIAL HISTORY: Social History   Socioeconomic History   Marital status: Widowed    Spouse name: Not on file   Number of children: Not on file   Years of education: Not on file   Highest education level: Not on file  Occupational History   Not on file  Tobacco Use   Smoking status: Never   Smokeless tobacco: Never  Vaping Use   Vaping status: Never Used  Substance and Sexual Activity   Alcohol use: No    Alcohol/week: 0.0 standard drinks of alcohol   Drug use: No   Sexual activity: Not on file  Other Topics Concern   Not on file  Social History Narrative   Not on file   Social Drivers of Health   Financial Resource Strain: Not on file  Food Insecurity: Not on file  Transportation Needs: Not on file  Physical Activity: Not on file  Stress: Not on file  Social Connections: Not on file  Intimate Partner Violence: Not on file    FAMILY HISTORY: Family History  Problem Relation Age of Onset   Breast cancer Mother        diagnosed early 73's   Hypertension Mother    Diabetes type II Mother    Kidney disease Mother    Congestive Heart Failure  Mother    Gout Mother    Heart disease Father    Hypertension Father    Seizures Father    Lymphoma Father    Epilepsy Father     ALLERGIES:  is allergic to fire ant, sulfa antibiotics, ambien [zolpidem], tramadol, betadine [povidone iodine], and povidone-iodine.  MEDICATIONS:  Current Outpatient Medications  Medication Sig Dispense Refill   amLODipine (NORVASC) 10 MG tablet 1 tablet Orally Once a day for 90 days     cetirizine (ZYRTEC) 10 MG tablet Take 10 mg by mouth daily.     dalfampridine  10 MG TB12 Take 1 tablet (10 mg total) by mouth 2 (two) times daily. 180  tablet 0   EPINEPHrine  (EPIPEN  2-PAK) 0.3 mg/0.3 mL IJ SOAJ injection Use as directed for life-threatening allergic reaction. 2 each 3   escitalopram (LEXAPRO) 20 MG tablet Take 20 mg by mouth daily.     ferrous sulfate (SLOW IRON ) 160 (50 Fe) MG TBCR SR tablet Take 1 tablet (160 mg total) by mouth daily. 30 tablet 6   fluticasone (FLONASE) 50 MCG/ACT nasal spray SHAKE LQ AND U 1 SPR IEN QD  2   ibandronate (BONIVA) 150 MG tablet Take 150 mg by mouth once a week.     levothyroxine (SYNTHROID) 50 MCG tablet Take 50 mcg by mouth daily.     rosuvastatin (CRESTOR) 20 MG tablet Take 20 mg by mouth daily.     Vitamin D , Ergocalciferol , (DRISDOL) 1.25 MG (50000 UNIT) CAPS capsule Take by mouth.     WELLBUTRIN  XL 150 MG 24 hr tablet 1 tablet in the morning Orally Once a day for 30 days     No current facility-administered medications for this visit.    REVIEW OF SYSTEMS:   Constitutional: ( - ) fevers, ( - )  chills , ( - ) night sweats Eyes: ( - ) blurriness of vision, ( - ) double vision, ( - ) watery eyes Ears, nose, mouth, throat, and face: ( - ) mucositis, ( - ) sore throat Respiratory: ( - ) cough, ( - ) dyspnea, ( - ) wheezes Cardiovascular: ( - ) palpitation, ( - ) chest discomfort, ( - ) lower extremity swelling Gastrointestinal:  ( - ) nausea, ( - ) heartburn, ( - ) change in bowel habits Skin: ( + ) abnormal skin rashes Lymphatics: ( - ) new lymphadenopathy, ( - ) easy bruising Neurological: ( - ) numbness, ( - ) tingling, ( - ) new weaknesses Behavioral/Psych: ( - ) mood change, ( - ) new changes  All other systems were reviewed with the patient and are negative.  PHYSICAL EXAMINATION: ECOG PERFORMANCE STATUS: 1 - Symptomatic but completely ambulatory  Vitals:   07/13/24 1318  BP: 130/75  Pulse: 79  Resp: 18  Temp: 98.3 F (36.8 C)  SpO2: 97%    Filed Weights   07/13/24 1318  Weight: 149 lb 1.6 oz (67.6 kg)     GENERAL: well appearing female in NAD  SKIN: skin  color, texture, turgor are normal, no significant lesions. Erythematous, non raised rash on left flank.  EYES: conjunctiva are pink and non-injected, sclera clear OROPHARYNX: no exudate, no erythema; lips, buccal mucosa, and tongue normal  NECK: supple, non-tender LUNGS: clear to auscultation and percussion with normal breathing effort HEART: regular rate & rhythm and no murmurs and no lower extremity edema Musculoskeletal: no cyanosis of digits and no clubbing  PSYCH: alert & oriented x 3, fluent speech NEURO: no focal  motor/sensory deficits  LABORATORY DATA:  I have reviewed the data as listed    Latest Ref Rng & Units 07/13/2024   12:58 PM 01/11/2024   10:05 AM 07/29/2023   10:29 AM  CBC  WBC 4.0 - 10.5 K/uL 6.8  6.2  7.7   Hemoglobin 12.0 - 15.0 g/dL 85.0  85.1  84.9   Hematocrit 36.0 - 46.0 % 44.0  43.7  44.9   Platelets 150 - 400 K/uL 274  280  370        Latest Ref Rng & Units 01/11/2024   10:05 AM 06/23/2023   11:41 AM 05/29/2022    1:35 PM  CMP  Glucose 70 - 99 mg/dL 96     BUN 8 - 23 mg/dL 16     Creatinine 9.55 - 1.00 mg/dL 9.09     Sodium 864 - 854 mmol/L 140     Potassium 3.5 - 5.1 mmol/L 3.6     Chloride 98 - 111 mmol/L 102     CO2 22 - 32 mmol/L 26     Calcium 8.9 - 10.3 mg/dL 9.5     Total Protein 6.5 - 8.1 g/dL 7.5  7.1  6.7   Total Bilirubin 0.0 - 1.2 mg/dL 0.8  0.6  0.7   Alkaline Phos 38 - 126 U/L 103  86  77   AST 15 - 41 U/L 32  20  23   ALT 0 - 44 U/L 37  28  33     ASSESSMENT & PLAN Cindy Frederick is a 68 y.o. female who presents to the hematology clinic for evaluation of leukopenia/neutropenia.   The differentials for neutropenia include benign ethnic neutropenia, infectious etiology, nutritional etiology, inflammatory etiology, or medication.  The patient is not currently taking any medications known to cause neutropenia. All pending labs were negative. WBC is normal today.   # Leukopenia/Neutropenia  WBC normal today with all outstanding workup  complete and negative.   #Borderline iron  deficiency: --She was noted to have mild anemia and opted to try to increase her dietary iron  instead of taking oral iron . She admits today that she has not been eating the best lately. I will contact her with her pending labs and she will start iron  if indicated.    No orders of the defined types were placed in this encounter.   All questions were answered. The patient knows to call the clinic with any problems, questions or concerns.  I have spent a total of 20 minutes minutes of face-to-face and non-face-to-face time, preparing to see the patient, obtaining and/or reviewing separately obtained history, performing a medically appropriate examination, counseling and educating the patient, ordering medications/tests/procedures, referring and communicating with other health care professionals, documenting clinical information in the electronic health record, independently interpreting results and communicating results to the patient, and care coordination.   Eleanor Bach, FNP-BC [facility]

## 2024-07-13 NOTE — Telephone Encounter (Signed)
 Patient has been scheduled for follow-up visit per 07/13/24 LOS.  Pt given an appt calendar with date and time.

## 2024-08-03 ENCOUNTER — Ambulatory Visit: Admitting: Neurology

## 2024-08-03 ENCOUNTER — Encounter: Payer: Self-pay | Admitting: Neurology

## 2024-08-03 ENCOUNTER — Telehealth: Payer: Self-pay | Admitting: Neurology

## 2024-08-03 VITALS — BP 131/77 | HR 75 | Ht 59.0 in | Wt 152.0 lb

## 2024-08-03 DIAGNOSIS — R269 Unspecified abnormalities of gait and mobility: Secondary | ICD-10-CM | POA: Diagnosis not present

## 2024-08-03 DIAGNOSIS — R350 Frequency of micturition: Secondary | ICD-10-CM

## 2024-08-03 DIAGNOSIS — R413 Other amnesia: Secondary | ICD-10-CM

## 2024-08-03 DIAGNOSIS — Z79899 Other long term (current) drug therapy: Secondary | ICD-10-CM | POA: Diagnosis not present

## 2024-08-03 DIAGNOSIS — G35D Multiple sclerosis, unspecified: Secondary | ICD-10-CM | POA: Diagnosis not present

## 2024-08-03 MED ORDER — ALPRAZOLAM 0.5 MG PO TABS: ORAL_TABLET | ORAL | 0 refills | Status: AC
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
## 2024-08-03 NOTE — Telephone Encounter (Signed)
 no auth required sent to GI [phone]

## 2024-08-16 ENCOUNTER — Other Ambulatory Visit: Payer: Self-pay

## 2024-08-16 DIAGNOSIS — R269 Unspecified abnormalities of gait and mobility: Secondary | ICD-10-CM

## 2024-08-16 DIAGNOSIS — G35D Multiple sclerosis, unspecified: Secondary | ICD-10-CM

## 2024-08-16 MED ORDER — DALFAMPRIDINE ER 10 MG PO TB12: 10.0000 mg | ORAL_TABLET | Freq: Two times a day (BID) | ORAL | 1 refills | Status: AC

## 2024-08-26 ENCOUNTER — Ambulatory Visit
Admission: RE | Admit: 2024-08-26 | Discharge: 2024-08-26 | Disposition: A | Discharge status: Home / Self Care | Source: Ambulatory Visit | Attending: Neurology | Admitting: Neurology

## 2024-08-26 ENCOUNTER — Ambulatory Visit: Payer: Self-pay | Admitting: Neurology

## 2024-08-26 DIAGNOSIS — G35D Multiple sclerosis, unspecified: Secondary | ICD-10-CM | POA: Diagnosis not present

## 2024-08-26 DIAGNOSIS — R269 Unspecified abnormalities of gait and mobility: Secondary | ICD-10-CM | POA: Diagnosis not present

## 2024-08-29 ENCOUNTER — Telehealth: Payer: Self-pay | Admitting: Neurology

## 2024-08-29 NOTE — Telephone Encounter (Signed)
 Pt has called for results to MRI. Pt was informed to allow Dr Vear time to review the results

## 2024-08-29 NOTE — Telephone Encounter (Signed)
 Pt called and LVM wanting a call back with her MRI results. Please advise.

## 2024-10-11 ENCOUNTER — Inpatient Hospital Stay: Payer: PRIVATE HEALTH INSURANCE

## 2024-10-11 ENCOUNTER — Other Ambulatory Visit: Payer: Self-pay | Admitting: Hematology and Oncology

## 2024-10-11 ENCOUNTER — Inpatient Hospital Stay: Payer: PRIVATE HEALTH INSURANCE | Admitting: Hematology and Oncology

## 2024-10-11 DIAGNOSIS — D709 Neutropenia, unspecified: Secondary | ICD-10-CM

## 2024-10-25 ENCOUNTER — Inpatient Hospital Stay: Payer: PRIVATE HEALTH INSURANCE | Admitting: Hematology and Oncology

## 2024-10-25 ENCOUNTER — Inpatient Hospital Stay: Payer: PRIVATE HEALTH INSURANCE

## 2024-10-28 ENCOUNTER — Inpatient Hospital Stay: Payer: PRIVATE HEALTH INSURANCE | Admitting: Hematology and Oncology

## 2024-10-28 ENCOUNTER — Inpatient Hospital Stay: Payer: PRIVATE HEALTH INSURANCE

## 2024-12-02 ENCOUNTER — Inpatient Hospital Stay: Payer: PRIVATE HEALTH INSURANCE | Admitting: Hematology and Oncology

## 2024-12-02 ENCOUNTER — Inpatient Hospital Stay: Payer: PRIVATE HEALTH INSURANCE

## 2025-03-09 ENCOUNTER — Ambulatory Visit: Admitting: Neurology
